# Patient Record
Sex: Male | Born: 1995 | Race: White | Hispanic: Yes | Marital: Single | Smoking: Never smoker
Health system: Southern US, Community
[De-identification: ages and names within clinical notes are randomized; demographics above are authoritative.]

---

## 2020-05-12 ENCOUNTER — Encounter (HOSPITAL_COMMUNITY): Payer: Self-pay | Admitting: Emergency Medicine

## 2020-05-12 ENCOUNTER — Emergency Department (HOSPITAL_COMMUNITY)
Admission: EM | Admit: 2020-05-12 | Discharge: 2020-05-12 | Disposition: A | Discharge status: Home / Self Care | Payer: Self-pay | Attending: Emergency Medicine | Admitting: Emergency Medicine

## 2020-05-12 ENCOUNTER — Other Ambulatory Visit: Payer: Self-pay

## 2020-05-12 DIAGNOSIS — R5383 Other fatigue: Secondary | ICD-10-CM | POA: Insufficient documentation

## 2020-05-12 DIAGNOSIS — R42 Dizziness and giddiness: Secondary | ICD-10-CM | POA: Insufficient documentation

## 2020-05-12 DIAGNOSIS — S0990XA Unspecified injury of head, initial encounter: Secondary | ICD-10-CM

## 2020-05-12 NOTE — ED Notes (Signed)
Pt ambulated independently to bathroom.

## 2020-05-12 NOTE — ED Provider Notes (Signed)
[facility] EMERGENCY DEPARTMENT Provider Note   CSN: [id number] Arrival date & time: 05/12/20  1051     History Chief Complaint  Patient presents with   Dizziness    David Patterson is a 24 y.o. male with no significant past medical history presenting to the ED with a chief complaint of fatigue, dizziness after head injury that occurred 5 days ago.  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  He denies any loss of consciousness.  States that since the head injury he has had dizziness, "mental fatigue" as well as nausea.  He has been placing ice in the area of the injury with some improvement in his pain.  He does admit that he has not been practicing any concussion precautions and has been using bright screens and working.  He has not been taking medications to help with his symptoms.  He denies any numbness in arms or legs, vomiting, neck pain, vision changes, anticoagulant use.  HPI     History reviewed. No pertinent past medical history.  There are no problems to display for this patient.   History reviewed. No pertinent surgical history.     No family history on file.  Social History   Tobacco Use   Smoking status: Never Smoker   Smokeless tobacco: Never Used  Substance Use Topics   Alcohol use: Never   Drug use: Never    Home Medications Prior to Admission medications   Not on File    Allergies    Mango flavor  Review of Systems   Review of Systems  Constitutional: Positive for fatigue. Negative for appetite change, chills and fever.  HENT: Negative for ear pain, rhinorrhea, sneezing and sore throat.   Eyes: Negative for photophobia and visual disturbance.  Respiratory: Negative for cough, chest tightness, shortness of breath and wheezing.   Cardiovascular: Negative for chest pain and palpitations.  Gastrointestinal: Negative for abdominal pain, blood in stool, constipation,  diarrhea, nausea and vomiting.  Genitourinary: Negative for dysuria, hematuria and urgency.  Musculoskeletal: Negative for myalgias.  Skin: Negative for rash.  Neurological: Positive for dizziness and headaches. Negative for weakness and light-headedness.    Physical Exam Updated Vital Signs BP (!) 117/92 (BP Location: Right Arm)    Pulse 79    Temp 98.6 F (37 C) (Oral)    Resp 16    Ht 5\' 10"  (1.778 m)    Wt 113.4 kg    SpO2 97%    BMI 35.87 kg/m   Physical Exam Vitals and nursing note reviewed.  Constitutional:      General: He is not in acute distress.    Appearance: He is well-developed.  HENT:     Head: Normocephalic and atraumatic.     Nose: Nose normal.  Eyes:     General: No scleral icterus.       Right eye: No discharge.        Left eye: No discharge.     Conjunctiva/sclera: Conjunctivae normal.     Pupils: Pupils are equal, round, and reactive to light.  Cardiovascular:     Rate and Rhythm: Normal rate and regular rhythm.     Heart sounds: Normal heart sounds. No murmur heard.  No friction rub. No gallop.   Pulmonary:     Effort: Pulmonary effort is normal. No respiratory distress.     Breath sounds: Normal breath sounds.  Abdominal:  General: Bowel sounds are normal. There is no distension.     Palpations: Abdomen is soft.     Tenderness: There is no abdominal tenderness. There is no guarding.  Musculoskeletal:        General: Normal range of motion.     Cervical back: Normal range of motion and neck supple.  Skin:    General: Skin is warm and dry.     Findings: No rash.  Neurological:     General: No focal deficit present.     Mental Status: He is alert and oriented to person, place, and time.     Cranial Nerves: No cranial nerve deficit.     Sensory: No sensory deficit.     Motor: No weakness or abnormal muscle tone.     Coordination: Coordination normal.     Comments: Pupils reactive. No facial asymmetry noted. Cranial nerves appear grossly intact.  Sensation intact to light touch on face, BUE and BLE. Strength 5/5 in BUE and BLE. Normal finger-to-nose coordination bilaterally.  Normal gait.     ED Results / Procedures / Treatments   Labs (all labs ordered are listed, but only abnormal results are displayed) Labs Reviewed - No data to display  EKG None  Radiology No results found.  Procedures Procedures (including critical care time)  Medications Ordered in ED Medications - No data to display  ED Course  I have reviewed the triage vital signs and the nursing notes.  Pertinent labs & imaging results that were available during my care of the patient were reviewed by me and considered in my medical decision making (see chart for details).    MDM Rules/Calculators/A&P                          24 year old male presenting to the ED with a chief complaint of dizziness, fatigue and nausea after head injury that occurred 5 days ago.  Reports pain to the back of his head where he was struck with a cardboard box filled with blood.  He is concerned due to his ongoing symptoms and is worried about a concussion. He denies anticoagulant use, vision changes, numbness in arms or legs or changes to gait.  On my exam he is overall well-appearing.  He has no neurological deficits noted, no weakness, numbness, confusion or changes to gait noted.  Speaking complete sentences without difficulty without aphasia.  I had a long discussion with the patient regarding risk and benefits of obtaining CT of the head versus continuing supportive treatment.  Informed patient that most likely his symptoms are due to a concussion which can last several days to weeks after injury.  Suspect that his symptoms may not be getting better due to not following any concussion precautions.  Patient contemplated whether or not he felt like a CT scan of the head was necessary and ultimately declined.  I feel that this is reasonable based on his reassuring work-up and time  elapsed from his head injury.  He knows to return for any worsening symptoms and given instructions regarding concussion precautions and will follow up with the concussion clinic.    Patient is hemodynamically stable, in NAD, and able to ambulate in the ED. Evaluation does not show pathology that would require ongoing emergent intervention or inpatient treatment. I explained the diagnosis to the patient. Pain has been managed and has no complaints prior to discharge. Patient is comfortable with above plan and is stable for discharge  at this time. All questions were answered prior to disposition. Strict return precautions for returning to the ED were discussed. Encouraged follow up with PCP.   An After Visit Summary was printed and given to the patient.   Portions of this note were generated with Scientist, clinical (histocompatibility and immunogenetics). Dictation errors may occur despite best attempts at proofreading.  Final Clinical Impression(s) / ED Diagnoses Final diagnoses:  Injury of head, initial encounter    Rx / DC Orders ED Discharge Orders    None       Dietrich Pates, PA-C 05/12/20 1525    Bethann Berkshire, MD 05/12/20 [phone]

## 2020-05-12 NOTE — ED Notes (Signed)
Pt verbalized understanding of discharge instructions. Follow up care and concussion symptoms and management reviewed, pt had no further questions. Ambulated independently to lobby.

## 2020-05-12 NOTE — Discharge Instructions (Addendum)
Follow up with the provider at the concussion clinic listed below. Take Tylenol as needed for discomfort.  Follow instructions regarding concussion precautions. Return to the ER for worsening headache, additional head injuries, numbness in arms or legs, blurry vision or trouble walking.

## 2020-05-12 NOTE — ED Triage Notes (Signed)
Pt states he got hit on the back of his head few days ago and he still feel very fatigue and dizziness from it. Pt requesting to have a CT scan.

## 2020-05-13 ENCOUNTER — Other Ambulatory Visit: Payer: Self-pay

## 2020-07-12 ENCOUNTER — Other Ambulatory Visit: Payer: Self-pay

## 2020-07-12 ENCOUNTER — Emergency Department (HOSPITAL_COMMUNITY)
Admission: EM | Admit: 2020-07-12 | Discharge: 2020-07-12 | Disposition: A | Discharge status: Home / Self Care | Payer: Self-pay | Attending: Emergency Medicine | Admitting: Emergency Medicine

## 2020-07-12 ENCOUNTER — Encounter (HOSPITAL_COMMUNITY): Payer: Self-pay

## 2020-07-12 ENCOUNTER — Emergency Department (HOSPITAL_COMMUNITY): Payer: Self-pay

## 2020-07-12 DIAGNOSIS — R030 Elevated blood-pressure reading, without diagnosis of hypertension: Secondary | ICD-10-CM | POA: Insufficient documentation

## 2020-07-12 DIAGNOSIS — R519 Headache, unspecified: Secondary | ICD-10-CM | POA: Insufficient documentation

## 2020-07-12 NOTE — ED Provider Notes (Signed)
[facility]-EMERGENCY DEPT Provider Note   CSN: [id number] Arrival date & time: 07/12/20  1340     History Chief Complaint  Patient presents with  . Headache    David Patterson is a 24 y.o. male.  Who complains of headache.  Patient states that he has had about 2 weeks of daily headaches.  When he takes Tylenol it goes away.  He states that the headaches are posterior, mild.  He states that he just feels "off."  He did have a concussion about 2 months ago which lasted about a week and a half he then went to an amusement park and rode roller coasters.  He realized that his blood pressure was high here is now worried that maybe that is the cause of his headaches.  He denies changes in vision, difficulty with speech or swallowing, unilateral weakness or paresthesia, difficulty with ambulation, confusion.  Patient denies vertigo symptoms.  He describes his headaches as pressure-like and more in the posterior/occipital region.  He has no other complaints at this time.  HPI     History reviewed. No pertinent past medical history.  There are no problems to display for this patient.   History reviewed. No pertinent surgical history.     No family history on file.  Social History   Tobacco Use  . Smoking status: Never Smoker  . Smokeless tobacco: Never Used  Substance Use Topics  . Alcohol use: Never  . Drug use: Never    Home Medications Prior to Admission medications   Not on File    Allergies    Mango flavor  Review of Systems   Review of Systems Ten systems reviewed and are negative for acute change, except as noted in the HPI.   Physical Exam Updated Vital Signs BP (!) 142/74   Pulse 80   Temp 98.9 F (37.2 C) (Oral)   Resp 18   SpO2 98%   Physical Exam Physical Exam  Constitutional: Pt is oriented to person, place, and time. Pt appears well-developed and well-nourished. No distress.  HENT:  Head: Normocephalic and atraumatic.    Mouth/Throat: Oropharynx is clear and moist.  Eyes: Conjunctivae and EOM are normal. Pupils are equal, round, and reactive to light. No scleral icterus.  No horizontal, vertical or rotational nystagmus  Neck: Normal range of motion. Neck supple.  Full active and passive ROM without pain No midline or paraspinal tenderness No nuchal rigidity or meningeal signs  Cardiovascular: Normal rate, regular rhythm and intact distal pulses.   Pulmonary/Chest: Effort normal and breath sounds normal. No respiratory distress. Pt has no wheezes. No rales.  Abdominal: Soft. Bowel sounds are normal. There is no tenderness. There is no rebound and no guarding.  Musculoskeletal: Normal range of motion.  Lymphadenopathy:    No cervical adenopathy.  Neurological: Pt. is alert and oriented to person, place, and time. He has normal reflexes. No cranial nerve deficit.  Exhibits normal muscle tone. Coordination normal.  Mental Status:  Alert, oriented, thought content appropriate. Speech fluent without evidence of aphasia. Able to follow 2 step commands without difficulty.  Cranial Nerves:  II:  Peripheral visual fields grossly normal, pupils equal, round, reactive to light III,IV, VI: ptosis not present, extra-ocular motions intact bilaterally  V,VII: smile symmetric, facial light touch sensation equal VIII: hearing grossly normal bilaterally  IX,X: midline uvula rise  XI: bilateral shoulder shrug equal and strong XII: midline tongue extension  Motor:  5/5 in upper and lower extremities bilaterally  including strong and equal grip strength and dorsiflexion/plantar flexion Sensory: Pinprick and light touch normal in all extremities.  Deep Tendon Reflexes: 2+ and symmetric  Cerebellar: normal finger-to-nose with bilateral upper extremities Gait: normal gait and balance CV: distal pulses palpable throughout   Skin: Skin is warm and dry. No rash noted. Pt is not diaphoretic.  Psychiatric: Pt has a normal mood  and affect. Behavior is normal. Judgment and thought content normal.  Nursing note and vitals reviewed.   ED Results / Procedures / Treatments   Labs (all labs ordered are listed, but only abnormal results are displayed) Labs Reviewed - No data to display  EKG None  Radiology CT Head Wo Contrast  Result Date: 07/12/2020 CLINICAL DATA:  Headache EXAM: CT HEAD WITHOUT CONTRAST TECHNIQUE: Contiguous axial images were obtained from the base of the skull through the vertex without intravenous contrast. COMPARISON:  None. FINDINGS: Brain: No acute territorial infarction, hemorrhage or intracranial mass. The ventricles are nonenlarged. Vascular: No hyperdense vessel or unexpected calcification. Skull: Normal. Negative for fracture or focal lesion. Sinuses/Orbits: No acute finding. Other: None IMPRESSION: Negative non contrasted CT appearance of the brain. Electronically Signed   By: Jasmine Pang M.D.   On: 07/12/2020 18:57    Procedures Procedures (including critical care time)  Medications Ordered in ED Medications - No data to display  ED Course  I have reviewed the triage vital signs and the nursing notes.  Pertinent labs & imaging results that were available during my care of the patient were reviewed by me and considered in my medical decision making (see chart for details).    MDM Rules/Calculators/A&P                         David Patterson presents with headache Given the large differential diagnosis for David Patterson, the decision making in this case is of high complexity. I personally reviewed the images of the head CT which showed no acute abnormalities.  Patient also here with asymptomatic hypertension.  Discussed lifestyle modifications and outpatient follow-up. After evaluating all of the data points in this case, the presentation of David Patterson is NOT consistent with skull fracture, meningitis/encephalitis, SAH/sentinel bleed, Intracranial Hemorrhage (ICH)  (subdural/epidural), acute obstructive hydrocephalus, space occupying lesions, CVA, CO Poisoning, Basilar/vertebral artery dissection, preeclampsia, cerebral venous thrombosis, hypertensive emergency, temporal Arteritis, Idiopathic Intracranial Hypertension (pseudotumor cerebri).  Strict return and follow-up precautions have been given by me personally or by detailed written instructions verbalized by nursing staff using the teach back method to patient/family/caregiver.  Data Reviewed/Counseling: I have reviewed the patient's vital signs, nursing notes, and other relevant tests/information. I had a detailed discussion regarding the historical points, exam findings, and any diagnostic results supporting the discharge diagnosis. I also discussed the need for outpatient follow-up and the need to return to the ED if symptoms worsen or if there are any questions or concerns that arise at hom   David Patterson was evaluated in Emergency Department on 07/12/2020 for the symptoms described in the history of present illness. He was evaluated in the context of the global COVID-19 pandemic, which necessitated consideration that the patient might be at risk for infection with the SARS-CoV-2 virus that causes COVID-19. Institutional protocols and algorithms that pertain to the evaluation of patients at risk for COVID-19 are in a state of rapid change based on information released by regulatory bodies including the CDC and federal and state organizations. These policies and algorithms were followed during  the patient's care in the ED.  Final Clinical Impression(s) / ED Diagnoses Final diagnoses:  None    Rx / DC Orders ED Discharge Orders    None       Arthor Captain, PA-C 07/12/20 1933    Benjiman Core, MD 07/12/20 2231

## 2020-07-12 NOTE — ED Notes (Signed)
An After Visit Summary was printed and given to the patient. Discharge instructions given and no further questions at this time.  

## 2020-07-12 NOTE — Discharge Instructions (Signed)
Get help right away if:  Your headache becomes severe quickly.  Your headache gets worse after moderate to intense physical activity.  You have repeated vomiting.  You have a stiff neck.  You have a loss of vision.  You have problems with speech.  You have pain in the eye or ear.  You have muscular weakness or loss of muscle control.  You lose your balance or have trouble walking.  You feel faint or pass out.  You have confusion.  You have a seizure.

## 2020-07-12 NOTE — ED Triage Notes (Signed)
Patient reports constant pressure in back of his head/ Patient reports he may have had a concussion about 1.5 months ago from a board hitting the back of his head.   Patient reports his pain today is near the spot he got hit at 1.5 months ago. [place] and having this new pressure for about 2 weeks.

## 2020-08-11 ENCOUNTER — Emergency Department (HOSPITAL_COMMUNITY)
Admission: EM | Admit: 2020-08-11 | Discharge: 2020-08-11 | Disposition: A | Discharge status: Home / Self Care | Payer: Medicaid Other | Attending: Emergency Medicine | Admitting: Emergency Medicine

## 2020-08-11 ENCOUNTER — Encounter (HOSPITAL_COMMUNITY): Payer: Self-pay

## 2020-08-11 DIAGNOSIS — K0889 Other specified disorders of teeth and supporting structures: Secondary | ICD-10-CM

## 2020-08-11 DIAGNOSIS — K029 Dental caries, unspecified: Secondary | ICD-10-CM | POA: Insufficient documentation

## 2020-08-11 LAB — CBG MONITORING, ED: Glucose-Capillary: 115 mg/dL — ABNORMAL HIGH (ref 70–99)

## 2020-08-11 MED ORDER — CLINDAMYCIN HCL 150 MG PO CAPS
450.0000 mg | ORAL_CAPSULE | Freq: Once | ORAL | Status: AC
  Administered 2020-08-11: 450 mg via ORAL
  Filled 2020-08-11: qty 3

## 2020-08-11 MED ORDER — CLINDAMYCIN HCL 150 MG PO CAPS: 450.0000 mg | ORAL_CAPSULE | Freq: Three times a day (TID) | ORAL | 0 refills | Status: AC

## 2020-08-11 MED ORDER — IBUPROFEN 400 MG PO TABS
600.0000 mg | ORAL_TABLET | Freq: Once | ORAL | Status: AC
  Administered 2020-08-11: 600 mg via ORAL
  Filled 2020-08-11: qty 1

## 2020-08-11 NOTE — ED Triage Notes (Signed)
Pt reports left upper dental pain for the past month, pt [place] amoxicillin [facility] [employer] 3 weeks ago and took a 7 day course, noticed some improvement but still having pain and some swelling.

## 2020-08-11 NOTE — ED Provider Notes (Signed)
[facility] EMERGENCY DEPARTMENT Provider Note   CSN: [id number] Arrival date & time: 08/11/20  1116     History Chief Complaint  Patient presents with  . Dental Pain    David Patterson is a 24 y.o. male with no significant past medical history who presents with a 3-week history of left upper dental pain.  Patient reports that he went [place] a [place] [employer] a few weeks ago and obtained amoxicillin 500 mg over-the-counter.  He states that he took it for 3 days and then discontinued the antibiotic.  He states that since then he has continued to endorse some left upper dental pain.  He does not see a dentist.  He does not have a primary care provider.  He has not taken anything for his symptoms of discomfort.  He noticed a little bit of maxillary region swelling the other day, but it has improved.  He does not have any medical or dental insurance.  Patient denies any fevers or chills, neck stiffness, recent travel, difficulty swallowing or breathing, chest pain, or other symptoms.   HPI     History reviewed. No pertinent past medical history.  There are no problems to display for this patient.   History reviewed. No pertinent surgical history.     No family history on file.  Social History   Tobacco Use  . Smoking status: Never Smoker  . Smokeless tobacco: Never Used  Substance Use Topics  . Alcohol use: Never  . Drug use: Never    Home Medications Prior to Admission medications   Medication Sig Start Date End Date Taking? Authorizing Provider  clindamycin (CLEOCIN) 150 MG capsule Take 3 capsules (450 mg total) by mouth 3 (three) times daily for 7 days. 08/11/20 08/18/20  Lorelee New, PA-C    Allergies    Mango flavor  Review of Systems   Review of Systems  Constitutional: Negative for fever.  HENT: Positive for dental problem and facial swelling. Negative for drooling, sore throat and trouble swallowing.   Respiratory: Negative for  shortness of breath, wheezing and stridor.     Physical Exam Updated Vital Signs BP 124/65 (BP Location: Right Arm)   Pulse 71   Temp 98.3 F (36.8 C) (Oral)   Resp 16   Ht 5\' 10"  (1.778 m)   Wt 117.9 kg   SpO2 99%   BMI 37.31 kg/m   Physical Exam Vitals and nursing note reviewed. Exam conducted with a chaperone present.  Constitutional:      General: He is not in acute distress.    Appearance: Normal appearance. He is not ill-appearing.  HENT:     Head: Normocephalic and atraumatic.     Mouth/Throat:     Comments: Carrie appreciated involving #12 tooth with mild surrounding erythema and swelling.  No fluctuance or appreciable abscess.  No trismus.  Tolerating secretions well.  Patent oropharynx.  No tongue swelling or floor of mouth induration.  Relatively good dentition throughout. Eyes:     General: No scleral icterus.    Conjunctiva/sclera: Conjunctivae normal.  Cardiovascular:     Rate and Rhythm: Normal rate and regular rhythm.     Pulses: Normal pulses.     Heart sounds: Normal heart sounds.  Pulmonary:     Effort: Pulmonary effort is normal. No respiratory distress.     Breath sounds: Normal breath sounds. No stridor. No wheezing.  Musculoskeletal:     Cervical back: Normal range of motion and neck supple.  No rigidity.  Skin:    General: Skin is dry.     Capillary Refill: Capillary refill takes less than 2 seconds.  Neurological:     Mental Status: He is alert and oriented to person, place, and time.     GCS: GCS eye subscore is 4. GCS verbal subscore is 5. GCS motor subscore is 6.  Psychiatric:        Mood and Affect: Mood normal.        Behavior: Behavior normal.        Thought Content: Thought content normal.     ED Results / Procedures / Treatments   Labs (all labs ordered are listed, but only abnormal results are displayed) Labs Reviewed  CBG MONITORING, ED - Abnormal; Notable for the following components:      Result Value   Glucose-Capillary 115  (*)    All other components within normal limits    EKG None  Radiology No results found.  Procedures Procedures (including critical care time)  Medications Ordered in ED Medications  clindamycin (CLEOCIN) capsule 450 mg (has no administration in time range)  ibuprofen (ADVIL) tablet 600 mg (has no administration in time range)    ED Course  I have reviewed the triage vital signs and the nursing notes.  Pertinent labs & imaging results that were available during my care of the patient were reviewed by me and considered in my medical decision making (see chart for details).    MDM Rules/Calculators/A&P                          Patient has a well visualized carie on the #12 tooth.  There is mild surrounding erythema and swelling, no appreciable abscess.  No fluctuance.  Patient felt faint in the ED and was concerned about his blood sugars.  Obtained point-of-care CBG.  Patient presents to the ED with dental pain, but without any abscess amenable to drainage.  He does have very mild left-sided maxillary swelling, but no warmth, redness, or other overlying skin changes.  I have low suspicion for a large mandibular or other deep tissue abscess at this time.  Do not feel as though CT imaging warranted at this time.  He denies any fevers or chills, difficulty swallowing or breathing, or other symptoms.  Patient able to speak in complete sentences without difficulty swallowing or trouble handling secretions.  No stridor, wheezing, tripoding, grey pseudomembrane, trismus, uvular deviation, sublingual/submandibular/submental swelling or induration, nuchal rigidity, or neck pain.  Low suspicion for deep tissue infection such as mandibular or maxillary abscess, PTA or RPA, epiglottitis, or other emergent pathology.   We will provide patient with first dose of clindamycin here in the ED as well as a shot of Toradol.  Will discharge him home with continued clindamycin antibiotics and provide  patient with dental resources.  I will also provide her with a referral to [facility] so that she may get established with a primary care provider for ongoing evaluation and management of her health and wellbeing.   Final Clinical Impression(s) / ED Diagnoses Final diagnoses:  Pain, dental    Rx / DC Orders ED Discharge Orders         Ordered    clindamycin (CLEOCIN) 150 MG capsule  3 times daily        08/11/20 1235           Lorelee New, PA-C 08/11/20 1255  Milagros Loll, MD 08/13/20 [phone]

## 2020-08-11 NOTE — Discharge Instructions (Signed)
Please read the attachment on local dental resources.  Please take clindamycin, as directed.  I recommend that you take ibuprofen 600 mg three times daily as needed for pain or inflammation.  The antibiotics will help, but you need definitive dental intervention.  Please see one as soon as possible.  I would also like you to establish with a primary care provider at [facility].  Please call them to schedule appointment.  Return to the ED or seek immediate medical attention should you experience any new or worsening symptoms.

## 2021-06-29 ENCOUNTER — Emergency Department (HOSPITAL_COMMUNITY)
Admission: EM | Admit: 2021-06-29 | Discharge: 2021-06-29 | Disposition: A | Discharge status: Home / Self Care | Payer: Medicaid Other | Attending: Emergency Medicine | Admitting: Emergency Medicine

## 2021-06-29 DIAGNOSIS — H5712 Ocular pain, left eye: Secondary | ICD-10-CM

## 2021-06-29 MED ORDER — FLUORESCEIN SODIUM 1 MG OP STRP
1.0000 | ORAL_STRIP | Freq: Once | OPHTHALMIC | Status: AC
  Administered 2021-06-29: 1 via OPHTHALMIC
  Filled 2021-06-29: qty 1

## 2021-06-29 MED ORDER — TETRACAINE HCL 0.5 % OP SOLN
2.0000 [drp] | Freq: Once | OPHTHALMIC | Status: AC
  Administered 2021-06-29: 2 [drp] via OPHTHALMIC
  Filled 2021-06-29: qty 4

## 2021-06-29 NOTE — ED Triage Notes (Signed)
Pt c/o L eye pain/ dryness x2 days. Was using alcohol spray & thinks some accidentally got in his eye. Pt able to see but endorses some "spots" in vision. L eye appears normal

## 2021-06-29 NOTE — ED Provider Notes (Signed)
[facility] EMERGENCY DEPARTMENT Provider Note   CSN: [id number] Arrival date & time: 06/29/21  1756     History Chief Complaint  Patient presents with   Eye Pain    David Patterson is a 25 y.o. male.   Eye Pain Pertinent negatives include no chest pain, no abdominal pain and no shortness of breath.   25 year old male with left eye pain x2 days.  Patient reports that 2 days ago, he was cleaning wound with isopropyl alcohol.  He states that when he sprayed it on the wound, he felt like some splashed into his eye.  He experiences burning pain in his left eye.  He washed his eye for 2 to 3 minutes and the pain improved significantly.  However, he states that he has had some mild constant, nonradiating burning sensation in the left eye since that time.  He also feels except as dry as he feels like when he lifts up his eyelid, it seems stuck to his eyeball.  He denies any eye redness.  He denies any right eye pain or redness.  He denies any drainage from the left eye.  He states that his left eye feels slightly more swollen than usual.  He denies any eye discharge.  He denies any blurry vision or double vision.  He does not wear contacts.  No history of similar symptoms.  No therapies tried at home.  No past medical history on file.  There are no problems to display for this patient.   No past surgical history on file.     No family history on file.  Social History   Tobacco Use   Smoking status: Never   Smokeless tobacco: Never  Substance Use Topics   Alcohol use: Never   Drug use: Never    Home Medications Prior to Admission medications   Not on File    Allergies    Mango flavor  Review of Systems   Review of Systems  Constitutional:  Negative for chills and fever.  HENT:  Negative for ear pain and sore throat.   Eyes:  Positive for pain. Negative for photophobia, redness, itching and visual disturbance.  Respiratory:  Negative for cough and  shortness of breath.   Cardiovascular:  Negative for chest pain and palpitations.  Gastrointestinal:  Negative for abdominal pain and vomiting.  Genitourinary:  Negative for dysuria and hematuria.  Musculoskeletal:  Negative for arthralgias and back pain.  Skin:  Negative for color change and rash.  Neurological:  Negative for seizures and syncope.  All other systems reviewed and are negative.  Physical Exam Updated Vital Signs BP (!) 146/80 (BP Location: Right Arm)   Pulse 73   Temp 98.2 F (36.8 C) (Oral)   Resp 18   SpO2 99%   Physical Exam Vitals and nursing note reviewed.  Constitutional:      Appearance: Normal appearance. He is well-developed and normal weight.  HENT:     Head: Normocephalic and atraumatic.  Eyes:     General:        Right eye: No discharge.        Left eye: No discharge.     Extraocular Movements: Extraocular movements intact.     Conjunctiva/sclera: Conjunctivae normal.     Pupils: Pupils are equal, round, and reactive to light.     Comments: No conjunctival injection.  On slit-lamp exam, no there is no cell and flare.  No increased fluorescein uptake.  Normal vision.  Cardiovascular:  Rate and Rhythm: Normal rate and regular rhythm.     Heart sounds: No murmur heard. Pulmonary:     Effort: Pulmonary effort is normal. No respiratory distress.     Breath sounds: Normal breath sounds.  Abdominal:     Palpations: Abdomen is soft.     Tenderness: There is no abdominal tenderness.  Musculoskeletal:     Cervical back: Neck supple.  Skin:    General: Skin is warm and dry.     Capillary Refill: Capillary refill takes less than 2 seconds.  Neurological:     Mental Status: He is alert and oriented to person, place, and time.    ED Results / Procedures / Treatments   Labs (all labs ordered are listed, but only abnormal results are displayed) Labs Reviewed - No data to display  EKG None  Radiology No results found.  Procedures Procedures    Medications Ordered in ED Medications  tetracaine (PONTOCAINE) 0.5 % ophthalmic solution 2 drop (2 drops Right Eye Given 06/29/21 2120)  fluorescein ophthalmic strip 1 strip (1 strip Left Eye Given 06/29/21 2120)    ED Course  I have reviewed the triage vital signs and the nursing notes.  Pertinent labs & imaging results that were available during my care of the patient were reviewed by me and considered in my medical decision making (see chart for details).    MDM Rules/Calculators/A&P                           25 year old male with left eye pain following exposure to isopropyl alcohol in the left eye 2 days ago.  Vital signs reviewed, within acceptable limits.  Differential includes iritis, chemical exposure, dry eyes, obstructed lacrimal gland.  He has no headache, pupils are reactive, no concern for acute angle-closure glaucoma.  No evidence of iritis on slit exam.  The patient's conjunctive are completely clear.  Visual acuity is normal.  No evidence of erosion or ulceration on fluorescein exam.  Presentation seems most consistent with dry eyes following isopropyl alcohol exposure.  He appears very comfortable.  Given his normal visual exam, I do not believe there is an indication for emergent irrigation given the duration since exposure and his normal ophthalmologic exam.  I believe he is stable for discharge with artificial tears at home.  Patient is comfortable with that plan.  Strict return precautions discussed.  Final Clinical Impression(s) / ED Diagnoses Final diagnoses:  Pain of left eye    Rx / DC Orders ED Discharge Orders     None        Lenard Lance, MD 06/29/21 2132    Cathren Laine, MD 06/29/21 2302

## 2021-06-29 NOTE — Discharge Instructions (Addendum)
Your physical exam was reassuring.  There is no evidence of an abrasion or other abnormality.  I suspect that the isopropyl alcohol dried out your eyes and cause some irritation.  I would recommend a lubricant eyedrop such as artificial tears.  The brands of refresh and Systane are generally recommended, but any over-the-counter artificial tear would suffice..  Please follow-up with your primary care doctor.

## 2021-06-29 NOTE — ED Notes (Signed)
Pt stepped outside.  

## 2021-06-29 NOTE — ED Provider Notes (Signed)
Emergency Medicine Provider Triage Evaluation Note  David Patterson , a 25 y.o. male  was evaluated in triage.  Pt complains of L eye irritation.  Review of Systems  Positive: L eye irritation, eye pain, spot in vision Negative: Fever, headache, diplopia, loss of vision  Physical Exam  BP (!) 143/81 (BP Location: Left Arm)   Pulse 79   Temp 98.5 F (36.9 C)   Resp 18   SpO2 100%  Gen:   Awake, no distress   Resp:  Normal effort  MSK:   Moves extremities without difficulty  Other:  L eye with normal appearance  Medical Decision Making  Medically screening exam initiated at 6:52 PM.  Appropriate orders placed.  David Patterson was informed that the remainder of the evaluation will be completed by another provider, this initial triage assessment does not replace that evaluation, and the importance of remaining in the ED until their evaluation is complete.  Pt report he was using isopropyl alcohol spray 2 days ago and some of the spray may have got into his L eye.  Since then he has noticed dryness and pain about the L eye.  Occasionally see some black spots in his vision.  Does not wear contact lens.     Fayrene Helper, PA-C 06/29/21 1854    Koleen Distance, MD 06/29/21 [phone]

## 2021-06-29 NOTE — ED Notes (Signed)
Patient verbalizes understanding of discharge instructions. Opportunity for questioning and answers were provided. Armband removed by staff, pt discharged from ED ambulatory.   

## 2021-10-16 ENCOUNTER — Encounter (HOSPITAL_COMMUNITY): Payer: Self-pay

## 2021-10-16 ENCOUNTER — Emergency Department (HOSPITAL_COMMUNITY): Payer: Self-pay

## 2021-10-16 ENCOUNTER — Emergency Department (HOSPITAL_COMMUNITY)
Admission: EM | Admit: 2021-10-16 | Discharge: 2021-10-16 | Disposition: A | Discharge status: Home / Self Care | Payer: Self-pay | Attending: Emergency Medicine | Admitting: Emergency Medicine

## 2021-10-16 DIAGNOSIS — R110 Nausea: Secondary | ICD-10-CM | POA: Insufficient documentation

## 2021-10-16 DIAGNOSIS — K5900 Constipation, unspecified: Secondary | ICD-10-CM | POA: Insufficient documentation

## 2021-10-16 DIAGNOSIS — R1030 Lower abdominal pain, unspecified: Secondary | ICD-10-CM | POA: Insufficient documentation

## 2021-10-16 LAB — CBC WITH DIFFERENTIAL/PLATELET
Abs Immature Granulocytes: 0.02 10*3/uL (ref 0.00–0.07)
Basophils Absolute: 0.1 10*3/uL (ref 0.0–0.1)
Basophils Relative: 1 %
Eosinophils Absolute: 0.1 10*3/uL (ref 0.0–0.5)
Eosinophils Relative: 2 %
HCT: 52.2 % — ABNORMAL HIGH (ref 39.0–52.0)
Hemoglobin: 17.7 g/dL — ABNORMAL HIGH (ref 13.0–17.0)
Immature Granulocytes: 0 %
Lymphocytes Relative: 27 %
Lymphs Abs: 1.8 10*3/uL (ref 0.7–4.0)
MCH: 29.4 pg (ref 26.0–34.0)
MCHC: 33.9 g/dL (ref 30.0–36.0)
MCV: 86.6 fL (ref 80.0–100.0)
Monocytes Absolute: 0.4 10*3/uL (ref 0.1–1.0)
Monocytes Relative: 5 %
Neutro Abs: 4.4 10*3/uL (ref 1.7–7.7)
Neutrophils Relative %: 65 %
Platelets: 318 10*3/uL (ref 150–400)
RBC: 6.03 MIL/uL — ABNORMAL HIGH (ref 4.22–5.81)
RDW: 11.9 % (ref 11.5–15.5)
WBC: 6.8 10*3/uL (ref 4.0–10.5)
nRBC: 0 % (ref 0.0–0.2)

## 2021-10-16 LAB — COMPREHENSIVE METABOLIC PANEL
ALT: 39 U/L (ref 0–44)
AST: 26 U/L (ref 15–41)
Albumin: 3.8 g/dL (ref 3.5–5.0)
Alkaline Phosphatase: 54 U/L (ref 38–126)
Anion gap: 9 (ref 5–15)
BUN: 14 mg/dL (ref 6–20)
CO2: 24 mmol/L (ref 22–32)
Calcium: 9.4 mg/dL (ref 8.9–10.3)
Chloride: 105 mmol/L (ref 98–111)
Creatinine, Ser: 0.91 mg/dL (ref 0.61–1.24)
GFR, Estimated: >60 mL/min (ref >60)
Glucose, Bld: 112 mg/dL — ABNORMAL HIGH (ref 70–99)
Potassium: 4.1 mmol/L (ref 3.5–5.1)
Sodium: 138 mmol/L (ref 135–145)
Total Bilirubin: 1 mg/dL (ref 0.3–1.2)
Total Protein: 6.9 g/dL (ref 6.5–8.1)

## 2021-10-16 LAB — URINALYSIS, ROUTINE W REFLEX MICROSCOPIC
Bilirubin Urine: NEGATIVE
Glucose, UA: NEGATIVE mg/dL
Hgb urine dipstick: NEGATIVE
Ketones, ur: NEGATIVE mg/dL
Leukocytes,Ua: NEGATIVE
Nitrite: NEGATIVE
Protein, ur: NEGATIVE mg/dL
Specific Gravity, Urine: 1.015 (ref 1.005–1.030)
pH: 7 (ref 5.0–8.0)

## 2021-10-16 LAB — LIPASE, BLOOD: Lipase: 27 U/L (ref 11–51)

## 2021-10-16 MED ORDER — IOHEXOL 300 MG/ML  SOLN
100.0000 mL | Freq: Once | INTRAMUSCULAR | Status: AC | PRN
  Administered 2021-10-16: 100 mL via INTRAVENOUS

## 2021-10-16 MED ORDER — ONDANSETRON HCL 4 MG PO TABS: 4.0000 mg | ORAL_TABLET | Freq: Three times a day (TID) | ORAL | 0 refills | Status: AC | PRN

## 2021-10-16 NOTE — Discharge Instructions (Addendum)
Your history, exam, work-up today were overall reassuring.  CT scan did not show any concerning findings and the ultrasound also did not show evidence of testicular abnormality.  Clinically I suspect you may have some constipation and dehydration leading to some of your bowel changes and discomfort.  There was no evidence of acute hernia.  Please follow-up with your primary doctor and try to increase your hydration and rest.  If any symptoms change or worsen acutely, please return to the nearest emergency department.  If you start having nausea, please use the nausea medicine.

## 2021-10-16 NOTE — ED Provider Notes (Signed)
Emergency Medicine Provider Triage Evaluation Note  David Patterson , a 25 y.o. male  was evaluated in triage.  Pt complains of lower abdominal discomfort.  Patient describes it as a "heaviness".  Pain has been present for last 3 to 4 days.  He endorses nausea however, no vomiting.  Patient states pain goes to bilateral testicles.  Denies penile discharge.  Patient is concerned about a possible hernia.  No previous abdominal operations.  Denies urinary symptoms.  Review of Systems  Positive: Abdominal pain Negative: fever  Physical Exam  BP 128/82 (BP Location: Right Arm)   Pulse 73   Temp 98.4 F (36.9 C) (Oral)   Resp 18   Ht 5\' 10"  (1.778 m)   Wt 117.9 kg   SpO2 99%   BMI 37.31 kg/m  Gen:   Awake, no distress   Resp:  Normal effort  MSK:   Moves extremities without difficulty  Other:  Mild lower abdominal tenderness. GU exam performed with chaperone in room. No testicular tenderness or edema.   Medical Decision Making  Medically screening exam initiated at 11:51 AM.  Appropriate orders placed.  Abdikadir Fohl was informed that the remainder of the evaluation will be completed by another provider, this initial triage assessment does not replace that evaluation, and the importance of remaining in the ED until their evaluation is complete.  Abdominal labs   Carita Pian 10/16/21 1153    14/12/22, MD 10/16/21 [phone]

## 2021-10-16 NOTE — ED Triage Notes (Signed)
Pt c/o lower abdominal pain described as bloating for 3-4 days. Endorses intermittent nausea without vomiting during that time. Pt states he has concern for hernia, though does not describe protrusion from belly. Pt endorses slight inguinal swelling.

## 2021-10-16 NOTE — ED Notes (Signed)
Discharge instructions reviewed with patient and family. Patient and family verbalized understanding of instructions. Follow-up care and medications were reviewed. Patient ambulatory with steady gait. VSS upon discharge.  °

## 2021-10-16 NOTE — ED Provider Notes (Signed)
[facility] EMERGENCY DEPARTMENT Provider Note   CSN: [id number] Arrival date & time: 10/16/21  1111     History Chief Complaint  Patient presents with   Abdominal Pain    David Patterson is a 25 y.o. male.  The history is provided by the patient and medical records. No language interpreter was used.  Abdominal Pain Pain location:  Suprapubic Pain quality: aching and cramping   Pain radiates to:  Scrotum Pain severity:  Moderate Onset quality:  Gradual Duration:  4 days Timing:  Constant Progression:  Waxing and waning Chronicity:  New Context: not previous surgeries and not recent illness   Relieved by:  Nothing Worsened by:  Nothing Ineffective treatments:  None tried Associated symptoms: constipation and nausea   Associated symptoms: no anorexia, no chest pain, no chills, no cough, no diarrhea, no fatigue, no fever, no hematuria, no shortness of breath and no vomiting       History reviewed. No pertinent past medical history.  There are no problems to display for this patient.   History reviewed. No pertinent surgical history.     No family history on file.  Social History   Tobacco Use   Smoking status: Never   Smokeless tobacco: Never  Substance Use Topics   Alcohol use: Never   Drug use: Never    Home Medications Prior to Admission medications   Not on File    Allergies    Mango flavor  Review of Systems   Review of Systems  Constitutional:  Negative for chills, diaphoresis, fatigue and fever.  HENT:  Negative for congestion.   Eyes:  Negative for photophobia.  Respiratory:  Negative for cough, chest tightness, shortness of breath and wheezing.   Cardiovascular:  Negative for chest pain, palpitations and leg swelling.  Gastrointestinal:  Positive for abdominal pain, constipation and nausea. Negative for abdominal distention, anorexia, blood in stool, diarrhea and vomiting.  Genitourinary:  Positive for genital sores and  testicular pain. Negative for decreased urine volume, flank pain, hematuria, penile pain, penile swelling and scrotal swelling.  Musculoskeletal:  Negative for back pain, neck pain and neck stiffness.  Skin:  Negative for rash and wound.  Neurological:  Negative for headaches.  Psychiatric/Behavioral:  Negative for agitation and confusion.   All other systems reviewed and are negative.  Physical Exam Updated Vital Signs BP 126/77 (BP Location: Right Arm)   Pulse 70   Temp 98.4 F (36.9 C) (Oral)   Resp 16   Ht 5\' 10"  (1.778 m)   Wt 117.9 kg   SpO2 99%   BMI 37.31 kg/m   Physical Exam Vitals and nursing note reviewed. Exam conducted with a chaperone present.  Constitutional:      General: He is not in acute distress.    Appearance: He is well-developed. He is not ill-appearing, toxic-appearing or diaphoretic.  HENT:     Head: Normocephalic and atraumatic.  Eyes:     Conjunctiva/sclera: Conjunctivae normal.  Cardiovascular:     Rate and Rhythm: Normal rate and regular rhythm.     Heart sounds: No murmur heard. Pulmonary:     Effort: Pulmonary effort is normal. No respiratory distress.     Breath sounds: Normal breath sounds.  Abdominal:     General: Abdomen is flat. Bowel sounds are normal. There is no distension.     Palpations: Abdomen is soft.     Tenderness: There is no abdominal tenderness. There is no right CVA tenderness, left CVA tenderness,  guarding or rebound.     Hernia: No hernia is present.  Genitourinary:    Penis: Normal.      Testes:        Right: Tenderness not present.        Left: Tenderness not present.  Musculoskeletal:        General: No swelling.     Cervical back: Neck supple.  Skin:    General: Skin is warm and dry.     Capillary Refill: Capillary refill takes less than 2 seconds.     Coloration: Skin is not pale.  Neurological:     Mental Status: He is alert.  Psychiatric:        Mood and Affect: Mood normal.    ED Results / Procedures  / Treatments   Labs (all labs ordered are listed, but only abnormal results are displayed) Labs Reviewed  CBC WITH DIFFERENTIAL/PLATELET - Abnormal; Notable for the following components:      Result Value   RBC 6.03 (*)    Hemoglobin 17.7 (*)    HCT 52.2 (*)    All other components within normal limits  COMPREHENSIVE METABOLIC PANEL - Abnormal; Notable for the following components:   Glucose, Bld 112 (*)    All other components within normal limits  LIPASE, BLOOD  URINALYSIS, ROUTINE W REFLEX MICROSCOPIC    EKG None  Radiology CT ABDOMEN PELVIS W CONTRAST  Result Date: 10/16/2021 CLINICAL DATA:  Abdominal pain, acute, nonlocalized EXAM: CT ABDOMEN AND PELVIS WITH CONTRAST TECHNIQUE: Multidetector CT imaging of the abdomen and pelvis was performed using the standard protocol following bolus administration of intravenous contrast. CONTRAST:  OMNIPAQUE IOHEXOL 300 MG/ML  SOLN COMPARISON:  None. FINDINGS: Lower chest: No acute abnormality. Hepatobiliary: No focal liver abnormality is seen. No gallstones, gallbladder wall thickening, or biliary dilatation. Pancreas: Unremarkable. No pancreatic ductal dilatation or surrounding inflammatory changes. Spleen: Normal in size without focal abnormality. Adrenals/Urinary Tract: Adrenals, kidneys, and partially distended bladder are unremarkable. Stomach/Bowel: Stomach is within normal limits. Bowel is normal in caliber. Normal appendix. Vascular/Lymphatic: No significant vascular abnormality on this noncontrast study. No enlarged lymph nodes. Reproductive: Unremarkable. Other: No free fluid.  Abdominal wall is unremarkable. 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 IMPRESSION: No acute abnormality. Electronically Signed   By: Guadlupe Spanish M.D.   On: 10/16/2021 14:51   US SCROTUM W/DOPPLER  Result Date: 10/16/2021 CLINICAL DATA:  Testicular pain, left greater than right EXAM: SCROTAL ULTRASOUND DOPPLER ULTRASOUND OF  THE TESTICLES TECHNIQUE: Complete ultrasound examination of the testicles, epididymis, and other scrotal structures was performed. Color and spectral Doppler ultrasound were also utilized to evaluate blood flow to the testicles. COMPARISON:  None. FINDINGS: Right testicle Measurements: 5.0 x 2.7 x 3.4 cm. No mass or microlithiasis visualized. Left testicle Measurements: 4.8 x 2.6 x 3.2 cm. No mass or microlithiasis visualized. Right epididymis:  Normal in size and appearance. Left epididymis:  Normal in size and appearance. Hydrocele:  None visualized. Varicocele:  None visualized. Pulsed Doppler interrogation of both testes demonstrates normal low resistance arterial and venous waveforms bilaterally. IMPRESSION: No testicular abnormality.  No evidence of torsion. Electronically Signed   By: Charlett Nose M.D.   On: 10/16/2021 22:01    Procedures Procedures   Medications Ordered in ED Medications  iohexol (OMNIPAQUE) 300 MG/ML solution 100 mL (100 mLs Intravenous Contrast Given 10/16/21 1433)    ED Course  I have reviewed the triage vital signs and the nursing notes.  Pertinent  labs & imaging results that were available during my care of the patient were reviewed by me and considered in my medical decision making (see chart for details).    MDM Rules/Calculators/A&P                           Avaneesh Genrich is a 25 y.o. male with no significant past medical history who presents with several days of abdominal discomfort and groin discomfort.  He reports that he has had pain for the last 3 to 4 days that was felt like aching or bloating in his lower abdomen that did go into his scrotum at times.  He denies any trauma but thought there may be some swelling concerning for hernia.  He has no history of hernia or testicular injury or torsion.  Denies any dysuria, hematuria, flank pain, or back pain.  Denies any fevers, chills congestion, cough.  He does report some nausea but no vomiting.  He does report  some constipation but no diarrhea.  No rectal bleeding or penile bleeding or discharge reported.  On exam, lungs clear and chest nontender.  Abdomen was nontender.  Normal bowel sounds.  No flank tenderness or back tenderness.  With a chaperone, testicular ultrasound and scrotal exam was reassuring.  No evidence of hernia on exam.  Patient was seen in triage and had CT imaging to rule out hernia and this was negative.  Patient also screening labs are reassuring.  Urinalysis did not show evidence of infection.  Patient was still expressing concern for testicular problem so given the reported intermittent pain, will get ultrasound to look for abnormalities.  Ultrasound reassuring.  Clinically I suspect some constipation causing some abdominal discomfort and will give prescription for nausea medicine.  Patient will increase hydration and rest and follow-up with PCP.  Again with questions or concerns and was discharged in good condition after reassuring work-up.         Final Clinical Impression(s) / ED Diagnoses Final diagnoses:  Lower abdominal pain  Nausea  Constipation, unspecified constipation type    Rx / DC Orders ED Discharge Orders          Ordered    ondansetron (ZOFRAN) 4 MG tablet  Every 8 hours PRN        10/16/21 2226            Clinical Impression: 1. Lower abdominal pain   2. Nausea   3. Constipation, unspecified constipation type     Disposition: Discharge  Condition: Good  I have discussed the results, Dx and Tx plan with the pt(& family if present). He/she/they expressed understanding and agree(s) with the plan. Discharge instructions discussed at great length. Strict return precautions discussed and pt &/or family have verbalized understanding of the instructions. No further questions at time of discharge.    New Prescriptions   ONDANSETRON (ZOFRAN) 4 MG TABLET    Take 1 tablet (4 mg total) by mouth every 8 (eight) hours as needed for nausea or  vomiting.    Follow Up: [facility] [phone] Schedule an appointment as soon as possible for a visit    [place] [id number] [address] [place]       Jonnathan Birman, Gwenyth Allegra, MD 10/16/21 2234

## 2021-12-23 IMAGING — CT CT HEAD W/O CM
3 series · 16 of 47 positions shown, 19 images · non-contrast
Comparison: None.

CLINICAL DATA: Headache

EXAM:
CT HEAD WITHOUT CONTRAST
TECHNIQUE: Contiguous axial images were obtained from the base of the skull
through the vertex without intravenous contrast.

[Series 3: head wo · axial · 0.44mm/px · z∈[+1637,+1772]mm · 10 of 33 slices shown, 13 images]
[im 3/33  brain]
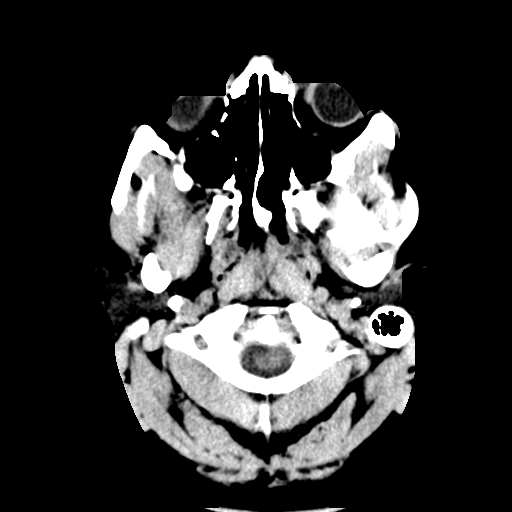
[im 3/33  bone]
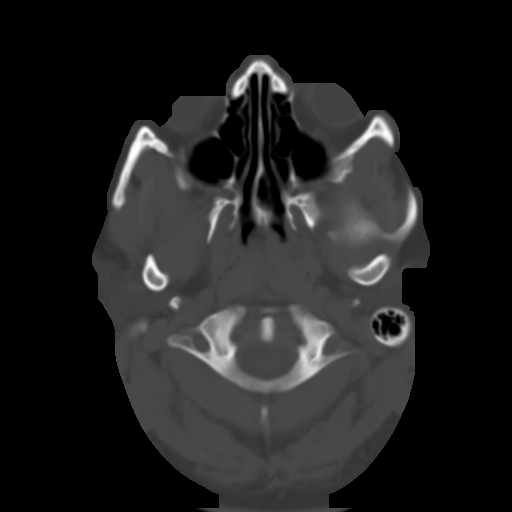
[im 6/33  brain]
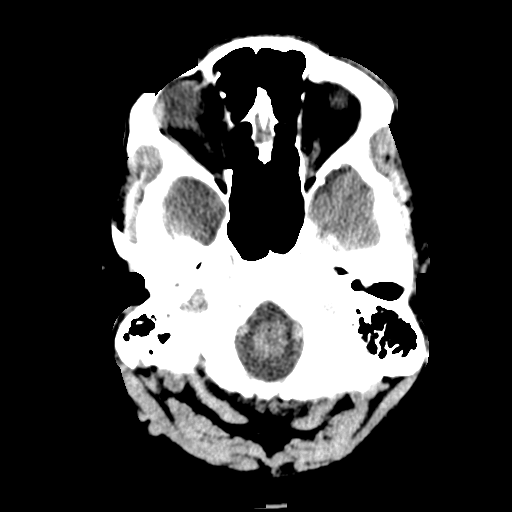
[im 9/33  brain]
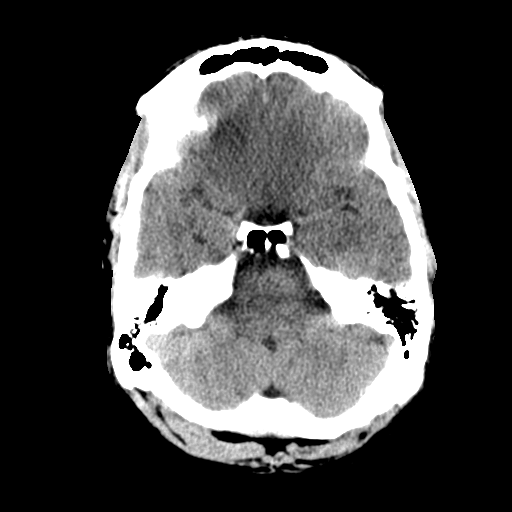
[im 12/33  brain]
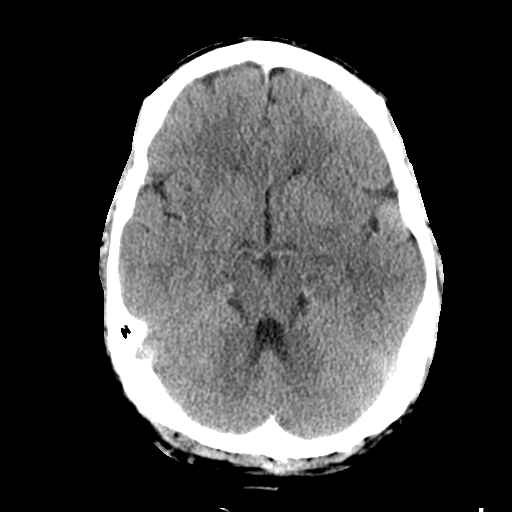
[im 15/33  brain]
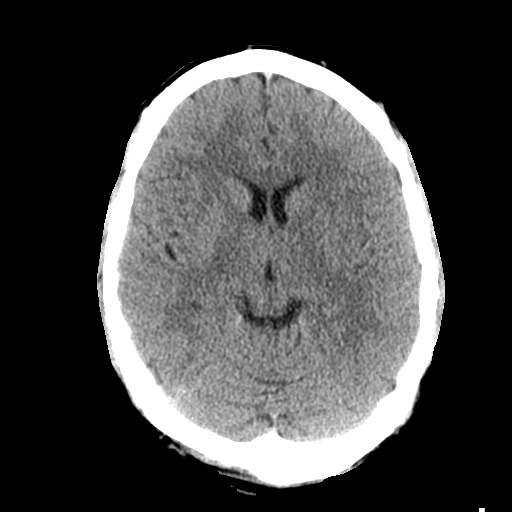
[im 15/33  bone]
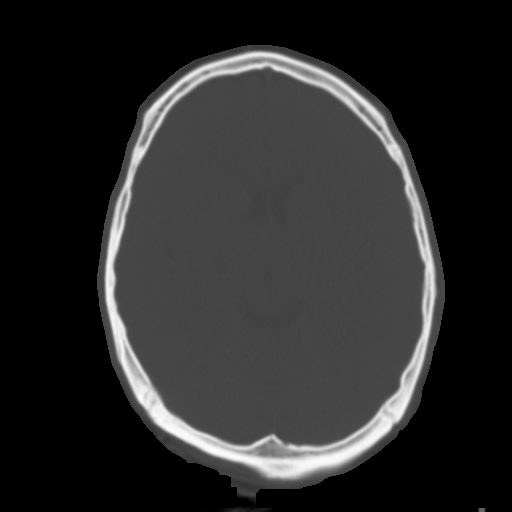
[im 18/33  brain]
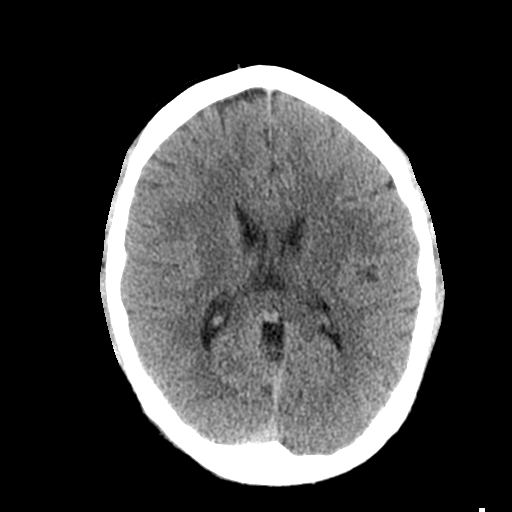
[im 21/33  brain]
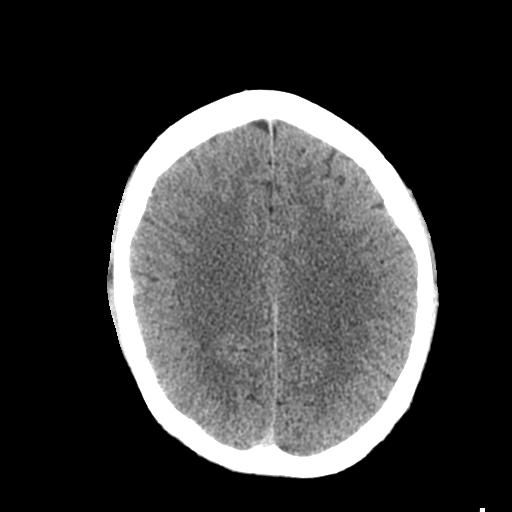
[im 25/33  brain]
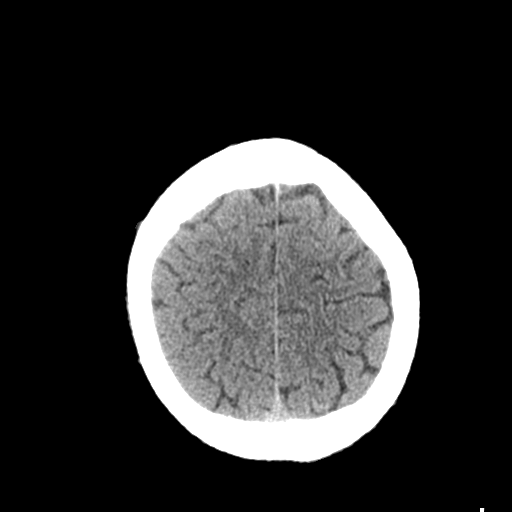
[im 27/33  brain]
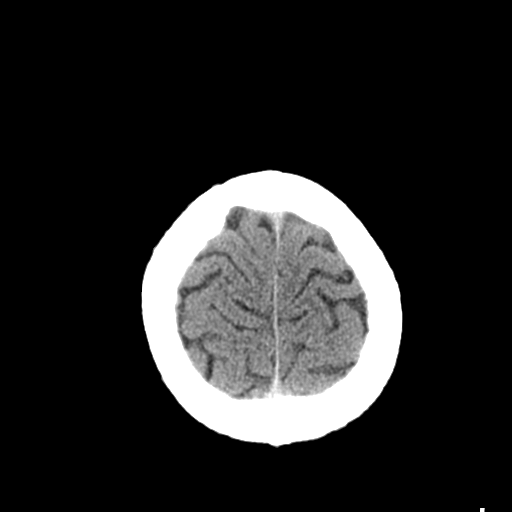
[im 27/33  bone]
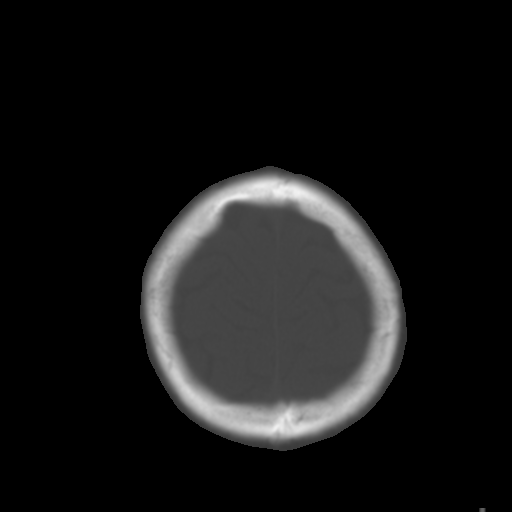
[im 30/33  brain]
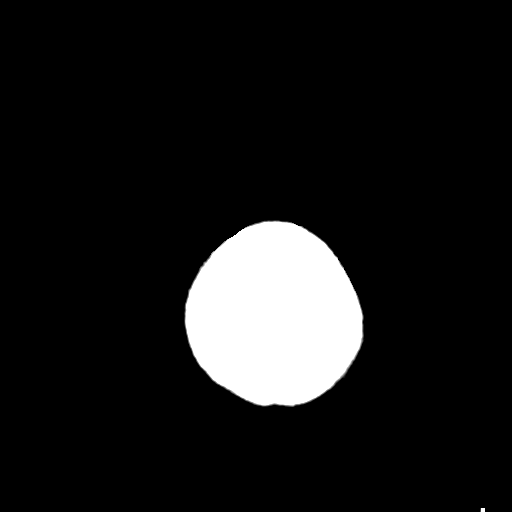

[Series 5: coronal soft tissue · coronal · 0.32mm/px · 3 of 70 slices shown]
[im 24/70  brain]
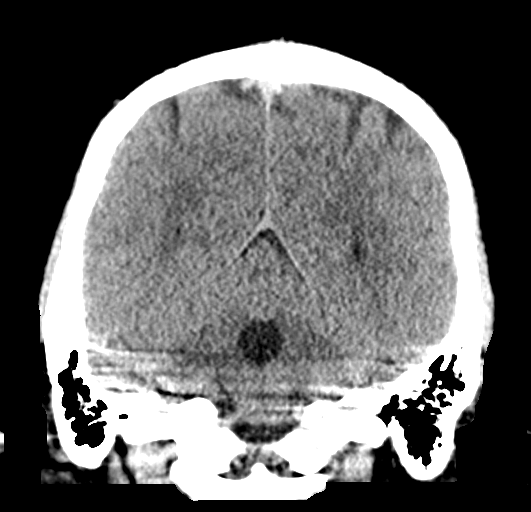
[im 31/70  brain]
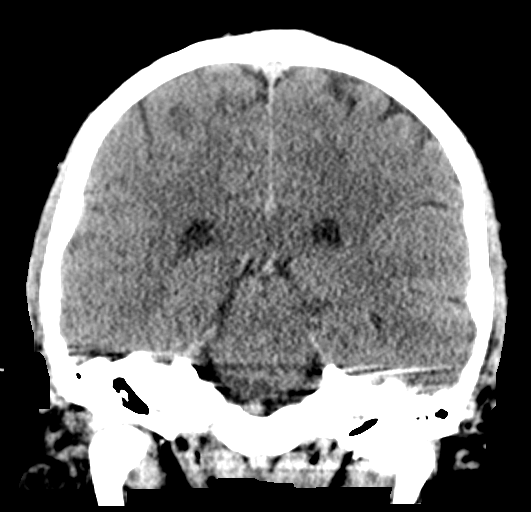
[im 39/70  brain]
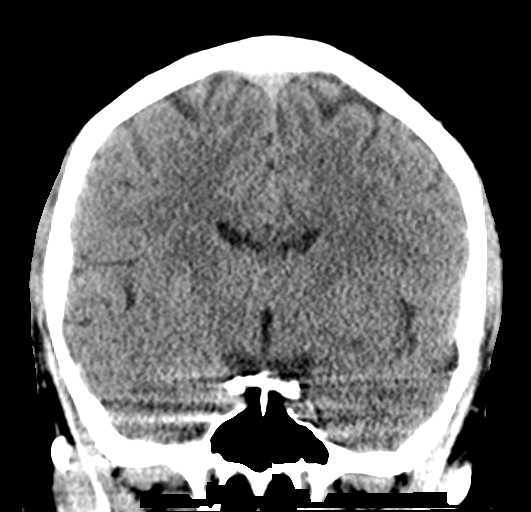

[Series 6: sagittal soft tissue · sagittal · 0.32mm/px · 3 of 58 slices shown]
[im 20/58  brain]
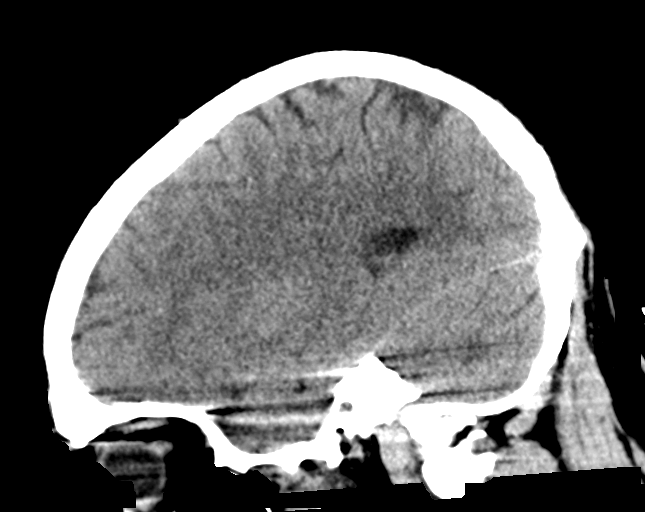
[im 29/58  brain]
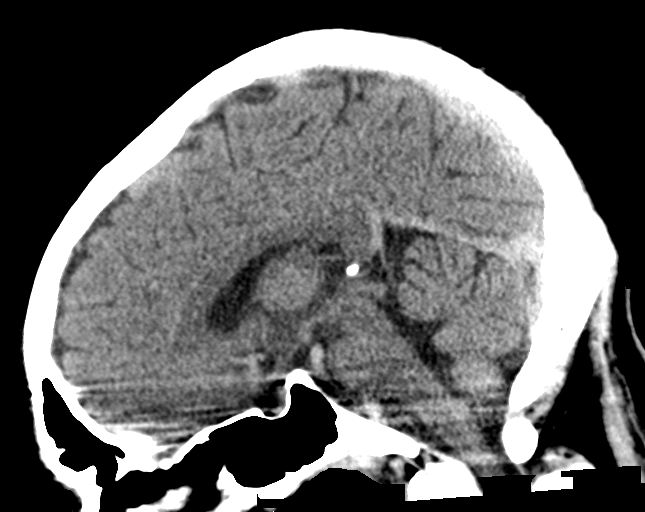
[im 39/58  brain]
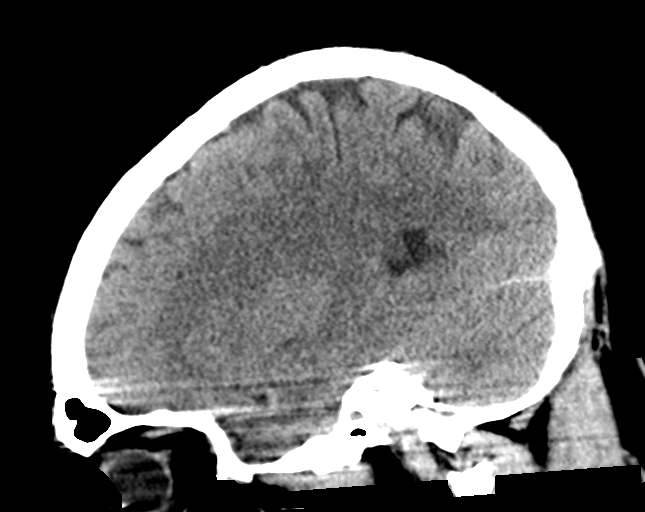

[16 of 47 positions shown; findings below may reference images not displayed]

FINDINGS: Brain: No acute territorial infarction, hemorrhage or intracranial
mass. The ventricles are nonenlarged.

Vascular: No hyperdense vessel or unexpected calcification.

Skull: Normal. Negative for fracture or focal lesion.

Sinuses/Orbits: No acute finding.

Other: None
IMPRESSION: Negative non contrasted CT appearance of the brain.

## 2023-01-27 ENCOUNTER — Telehealth: Payer: Self-pay

## 2023-01-27 ENCOUNTER — Other Ambulatory Visit: Payer: Self-pay

## 2023-01-27 ENCOUNTER — Encounter (HOSPITAL_COMMUNITY): Payer: Self-pay | Admitting: Emergency Medicine

## 2023-01-27 ENCOUNTER — Emergency Department (HOSPITAL_COMMUNITY)
Admission: EM | Admit: 2023-01-27 | Discharge: 2023-01-27 | Disposition: A | Discharge status: Home / Self Care | Payer: Medicaid Other | Attending: Emergency Medicine | Admitting: Emergency Medicine

## 2023-01-27 DIAGNOSIS — R220 Localized swelling, mass and lump, head: Secondary | ICD-10-CM

## 2023-01-27 DIAGNOSIS — K047 Periapical abscess without sinus: Secondary | ICD-10-CM | POA: Insufficient documentation

## 2023-01-27 MED ORDER — AMOXICILLIN-POT CLAVULANATE 875-125 MG PO TABS
1.0000 | ORAL_TABLET | Freq: Once | ORAL | Status: AC
  Administered 2023-01-27: 1 via ORAL
  Filled 2023-01-27: qty 1

## 2023-01-27 MED ORDER — NAPROXEN 250 MG PO TABS
500.0000 mg | ORAL_TABLET | Freq: Once | ORAL | Status: AC
  Administered 2023-01-27: 500 mg via ORAL
  Filled 2023-01-27: qty 2

## 2023-01-27 MED ORDER — AMOXICILLIN-POT CLAVULANATE 875-125 MG PO TABS: 1.0000 | ORAL_TABLET | Freq: Two times a day (BID) | ORAL | 0 refills | Status: DC

## 2023-01-27 MED ORDER — NAPROXEN 500 MG PO TABS: 500.0000 mg | ORAL_TABLET | Freq: Two times a day (BID) | ORAL | 0 refills | Status: DC

## 2023-01-27 NOTE — ED Provider Notes (Signed)
[facility] Provider Note   CSN: [id number] Arrival date & time: 01/27/23  [id number]     History  Chief Complaint  Patient presents with   Facial Swelling    David Patterson is a 27 y.o. male with no significant past medical history who presents to the ED due to right upper dental pain for the past few days associated with right-sided facial edema that started last night.  No difficulty swallowing or breathing.  Denies fever.  Has not seen a dentist in numerous years.  Patient notes he has a chipped right upper posterior molar.  Denies stiff neck.  History obtained from patient and past medical records. No interpreter used during encounter.       Home Medications Prior to Admission medications   Medication Sig Start Date End Date Taking? Authorizing Provider  amoxicillin-clavulanate (AUGMENTIN) 875-125 MG tablet Take 1 tablet by mouth every 12 (twelve) hours. 01/27/23  Yes Leontine Radman C, PA-C  naproxen (NAPROSYN) 500 MG tablet Take 1 tablet (500 mg total) by mouth 2 (two) times daily. 01/27/23  Yes Beverlee Wilmarth C, PA-C  ondansetron (ZOFRAN) 4 MG tablet Take 1 tablet (4 mg total) by mouth every 8 (eight) hours as needed for nausea or vomiting. 10/16/21   Tegeler, Gwenyth Allegra, MD      Allergies    Mango flavor    Review of Systems   Review of Systems  Constitutional:  Negative for fever.  HENT:  Positive for dental problem. Negative for trouble swallowing and voice change.   Respiratory:  Negative for shortness of breath.     Physical Exam Updated Vital Signs BP 135/67   Pulse 82   Temp 98.3 F (36.8 C) (Oral)   Resp 16   Ht 5\' 10"  (1.778 m)   Wt 122.5 kg   SpO2 100%   BMI 38.74 kg/m  Physical Exam Vitals and nursing note reviewed.  Constitutional:      General: He is not in acute distress.    Appearance: He is not ill-appearing.  HENT:     Head: Normocephalic.     Mouth/Throat:     Comments: Chipped right  upper posterior molar. Some right sided facial edema to right cheek, does not extend to eye or lower jaw.  Eyes:     Pupils: Pupils are equal, round, and reactive to light.  Cardiovascular:     Rate and Rhythm: Normal rate and regular rhythm.     Pulses: Normal pulses.     Heart sounds: Normal heart sounds. No murmur heard.    No friction rub. No gallop.  Pulmonary:     Effort: Pulmonary effort is normal.     Breath sounds: Normal breath sounds.     Comments: Airway patent.  Abdominal:     General: Abdomen is flat. There is no distension.     Palpations: Abdomen is soft.     Tenderness: There is no abdominal tenderness. There is no guarding or rebound.  Musculoskeletal:        General: Normal range of motion.     Cervical back: Neck supple.  Skin:    General: Skin is warm and dry.  Neurological:     General: No focal deficit present.     Mental Status: He is alert.  Psychiatric:        Mood and Affect: Mood normal.        Behavior: Behavior normal.     ED Results /  Procedures / Treatments   Labs (all labs ordered are listed, but only abnormal results are displayed) Labs Reviewed - No data to display  EKG None  Radiology No results found.  Procedures Procedures    Medications Ordered in ED Medications  naproxen (NAPROSYN) tablet 500 mg (500 mg Oral Given 01/27/23 0732)  amoxicillin-clavulanate (AUGMENTIN) 875-125 MG per tablet 1 tablet (1 tablet Oral Given 01/27/23 0732)    ED Course/ Medical Decision Making/ A&P                             Medical Decision Making Risk Prescription drug management.   27 year old male presents to the ED due to right-sided dental pain and facial edema that started last night.  Has not seen a dentist in numerous years.  No fever.  Denies trismus, changes to phonation, difficulty swallowing, difficulties breathing.  Upon arrival, vitals all within normal limits.  Patient in no acute distress.  Physical exam significant for chipped  right upper posterior molar.  Right-sided facial edema localized to right cheek which does not extend to eye or lower jaw.  Airway patent.  No evidence of respiratory distress.  No drainable abscess on exam.  No trismus.  Patient tolerating oral secretions without difficulty.  No meningismus to suggest meningitis. Low suspicion for Ludwig's or deep space infection. Patient treated with Augmentin and naproxen here in the ED and discharged with same. Dental resources given at discharge. Strict ED precautions discussed with patient. Patient states understanding and agrees to plan. Patient discharged home in no acute distress and stable vitals  No PCP       Final Clinical Impression(s) / ED Diagnoses Final diagnoses:  Facial swelling  Dental infection    Rx / DC Orders ED Discharge Orders          Ordered    amoxicillin-clavulanate (AUGMENTIN) 875-125 MG tablet  Every 12 hours        01/27/23 0736    naproxen (NAPROSYN) 500 MG tablet  2 times daily        01/27/23 0736              Suzy Bouchard, PA-C 01/27/23 [id number]    Blanchie Dessert, MD 01/27/23 1409

## 2023-01-27 NOTE — ED Triage Notes (Signed)
Pt c/o right sided facial swelling that started x 2 hours ago. Pt states that he thinks it is from his tooth, c/o dental pain as well

## 2023-01-27 NOTE — Telephone Encounter (Signed)
Patient was discharged early morning hours consult  for medication assistance. Patient does have employment listed, uninsured. Called patient to speak to him regarding this, no answer left a confidential message to return call to discuss. With good Rx medications ordered would be 42 dollars.

## 2023-01-27 NOTE — Discharge Instructions (Addendum)
It was a pleasure taking care of you today.  As discussed, I am sending you home with pain medication and an antibiotic.  Take antibiotic as prescribed and finish all antibiotics.  I have included dental resources.  Please call tomorrow to schedule an appointment for further evaluation.  Return to the ER for new or worsening symptoms.

## 2023-02-09 ENCOUNTER — Ambulatory Visit
Admission: EM | Admit: 2023-02-09 | Discharge: 2023-02-09 | Disposition: A | Discharge status: Home / Self Care | Payer: Medicaid Other

## 2023-02-09 DIAGNOSIS — K047 Periapical abscess without sinus: Secondary | ICD-10-CM

## 2023-02-09 MED ORDER — CLINDAMYCIN HCL 150 MG PO CAPS: 450.0000 mg | ORAL_CAPSULE | Freq: Three times a day (TID) | ORAL | 0 refills | Status: AC

## 2023-02-09 NOTE — ED Provider Notes (Signed)
[facility]    CSN: [id number] Arrival date & time: 02/09/23  1437      History   Chief Complaint Chief Complaint  Patient presents with   Dental Pain    HPI David Patterson is a 27 y.o. male.   Patient presents with right upper dental pain that started about 1 month ago.  He was seen at the end of March and prescribed Augmentin antibiotic.  He reports that he saw improvement in symptoms, but when he stopped the antibiotic the symptoms returned.  He has an appointment on 4/15 with dentist.  He denies any fever, body aches, chills.  Denies trauma to the face. He completed the augmentin.    Dental Pain   History reviewed. No pertinent past medical history.  There are no problems to display for this patient.   History reviewed. No pertinent surgical history.     Home Medications    Prior to Admission medications   Medication Sig Start Date End Date Taking? Authorizing Provider  clindamycin (CLEOCIN) 150 MG capsule Take 3 capsules (450 mg total) by mouth every 8 (eight) hours for 5 days. 02/09/23 02/14/23 Yes Rocklin Soderquist, Rolly Salter E, FNP  ibuprofen (ADVIL) 200 MG tablet Take 200 mg by mouth every 6 (six) hours as needed. Last taken: 400mg  around 1145am.   Yes [provider]  naproxen (NAPROSYN) 500 MG tablet Take 1 tablet (500 mg total) by mouth 2 (two) times daily. 01/27/23   Mannie Stabile, PA-C  ondansetron (ZOFRAN) 4 MG tablet Take 1 tablet (4 mg total) by mouth every 8 (eight) hours as needed for nausea or vomiting. 10/16/21   Tegeler, Canary Brim, MD    Family History History reviewed. No pertinent family history.  Social History Social History   Tobacco Use   Smoking status: Never   Smokeless tobacco: Never  Vaping Use   Vaping Use: Never used  Substance Use Topics   Alcohol use: Not Currently   Drug use: Never     Allergies   Mango flavor   Review of Systems Review of Systems Per HPI  Physical Exam Triage Vital Signs ED Triage  Vitals  Enc Vitals Group     BP 02/09/23 1447 125/77     Pulse Rate 02/09/23 1447 77     Resp 02/09/23 1447 20     Temp 02/09/23 1447 98.4 F (36.9 C)     Temp Source 02/09/23 1447 Oral     SpO2 02/09/23 1447 96 %     Weight 02/09/23 1443 265 lb (120.2 kg)     Height 02/09/23 1443 5\' 11"  (1.803 m)     Head Circumference --      Peak Flow --      Pain Score 02/09/23 1443 5     Pain Loc --      Pain Edu? --      Excl. in GC? --    No data found.  Updated Vital Signs BP 125/77 (BP Location: Left Arm)   Pulse 77   Temp 98.4 F (36.9 C) (Oral)   Resp 20   Ht 5\' 11"  (1.803 m)   Wt 265 lb (120.2 kg)   SpO2 96%   BMI 36.96 kg/m   Visual Acuity Right Eye Distance:   Left Eye Distance:   Bilateral Distance:    Right Eye Near:   Left Eye Near:    Bilateral Near:     Physical Exam Constitutional:      General: He  is not in acute distress.    Appearance: Normal appearance. He is not toxic-appearing or diaphoretic.  HENT:     Head: Normocephalic and atraumatic.     Mouth/Throat:     Comments: Patient has pustular like pimple-like lesion present to right upper dentition with surrounding mild gingival swelling and erythema. Eyes:     Extraocular Movements: Extraocular movements intact.     Conjunctiva/sclera: Conjunctivae normal.  Pulmonary:     Effort: Pulmonary effort is normal.  Neurological:     General: No focal deficit present.     Mental Status: He is alert and oriented to person, place, and time. Mental status is at baseline.  Psychiatric:        Mood and Affect: Mood normal.        Behavior: Behavior normal.        Thought Content: Thought content normal.        Judgment: Judgment normal.      UC Treatments / Results  Labs (all labs ordered are listed, but only abnormal results are displayed) Labs Reviewed - No data to display  EKG   Radiology No results found.  Procedures Procedures (including critical care time)  Medications Ordered in  UC Medications - No data to display  Initial Impression / Assessment and Plan / UC Course  I have reviewed the triage vital signs and the nursing notes.  Pertinent labs & imaging results that were available during my care of the patient were reviewed by me and considered in my medical decision making (see chart for details).     Patient has dental infection on exam.  No obvious large abscess that needs drainage.  Will treat with clindamycin given the patient just recently completed Augmentin.  Advised to take this with food.  Discussed supportive care and symptom management.  Advised keeping appointment with the dentist for further evaluation and management.  Patient verbalized understanding and was agreeable with plan. Final Clinical Impressions(s) / UC Diagnoses   Final diagnoses:  Dental infection     Discharge Instructions      I have prescribed a different antibiotic.  Take this with food to avoid stomach upset.  Follow-up with dentist for further evaluation and management.    ED Prescriptions     Medication Sig Dispense Auth. Provider   clindamycin (CLEOCIN) 150 MG capsule Take 3 capsules (450 mg total) by mouth every 8 (eight) hours for 5 days. 45 capsule Hale, Acie Fredrickson, [place]      PDMP not reviewed this encounter.   Gustavus Bryant, [place] 02/09/23 1606

## 2023-02-09 NOTE — Discharge Instructions (Signed)
I have prescribed a different antibiotic.  Take this with food to avoid stomach upset.  Follow-up with dentist for further evaluation and management.

## 2023-02-09 NOTE — ED Triage Notes (Signed)
"  I have swelling on right side of face due to dental pain/abscess". "I was on antibiotics for this about 2 wks ago but dentis appointment is not until the 15th of April 2024". No fever. "I think tooth is still abscessed".

## 2023-03-29 IMAGING — US US SCROTUM W/ DOPPLER COMPLETE
1 series · 14 of 25 positions shown · non-contrast
Comparison: None.

CLINICAL DATA: Testicular pain, left greater than right

EXAM:
SCROTAL ULTRASOUND
DOPPLER ULTRASOUND OF THE TESTICLES
TECHNIQUE: Complete ultrasound examination of the testicles, epididymis, and
other scrotal structures was performed. Color and spectral Doppler
ultrasound were also utilized to evaluate blood flow to the
testicles.

[Series 1: us scrotum w/doppler · 41 acquisitions, 14 frames shown]
[im 1/41]
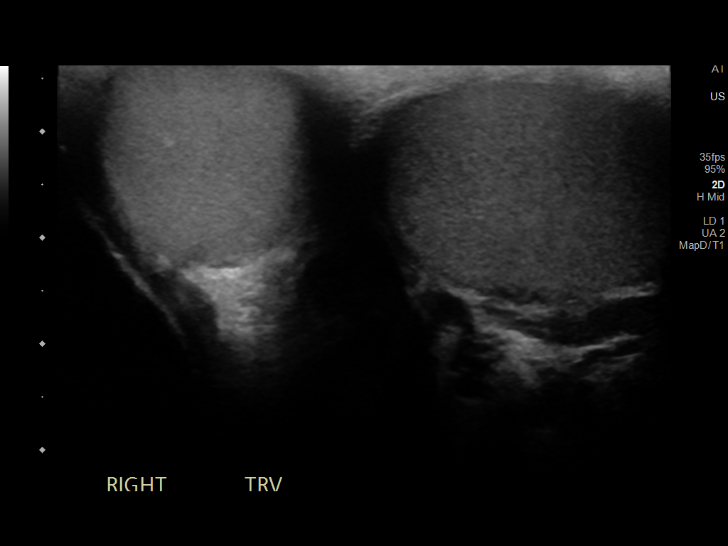
[im 4/41]
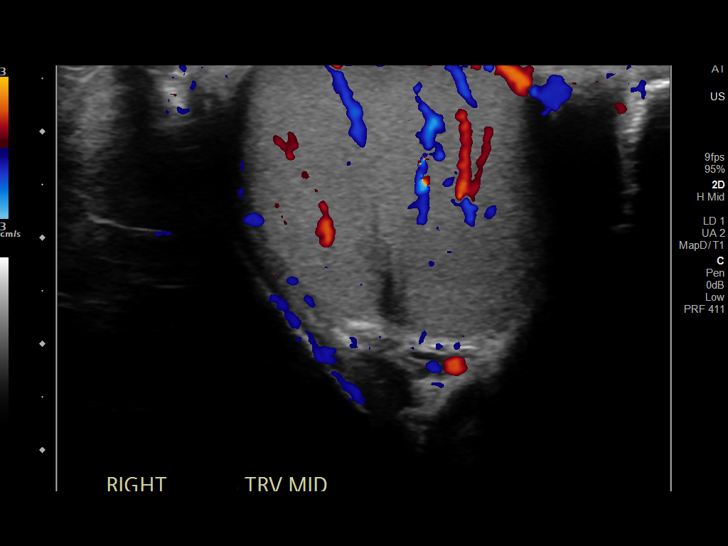
[im 7/41]
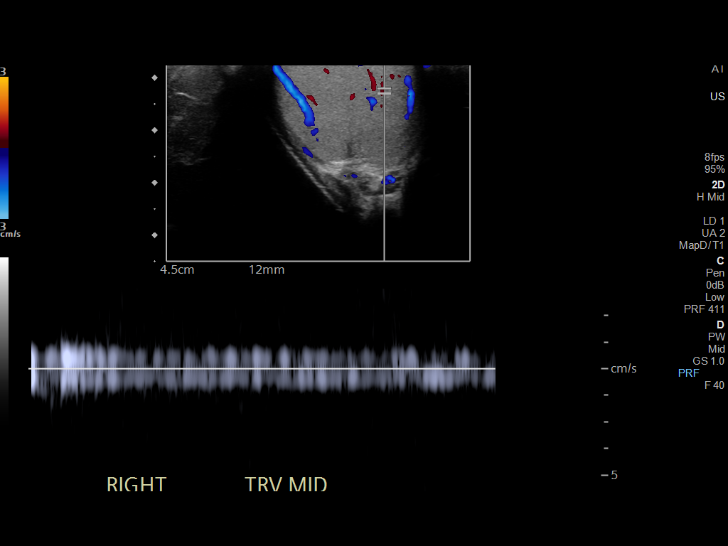
[im 11/41]
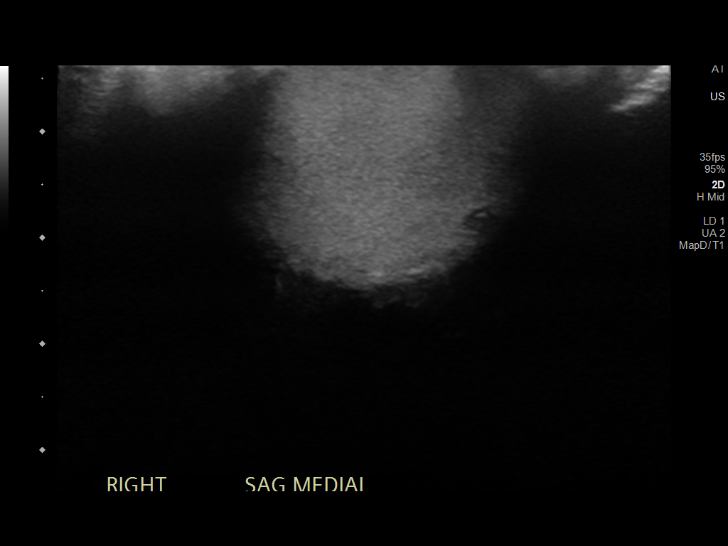
[im 14/41]
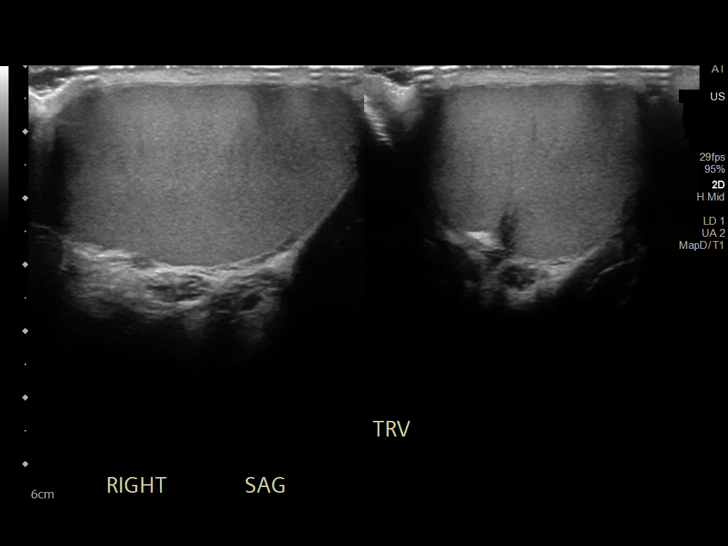
[im 16/41]
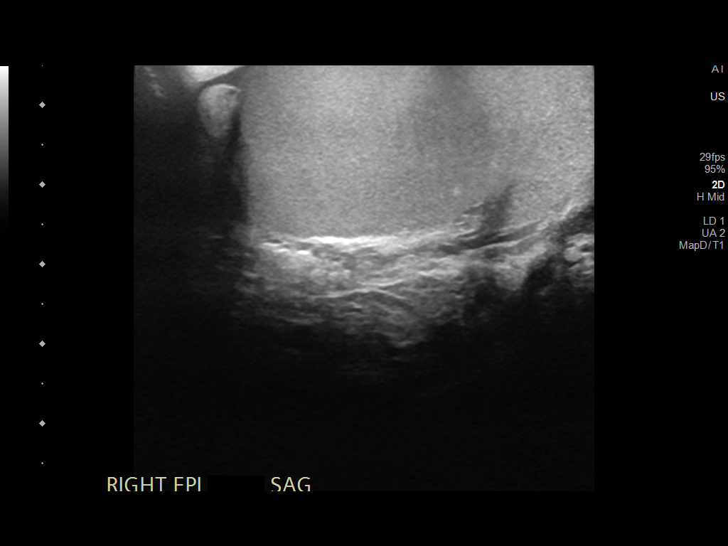
[im 19/41]
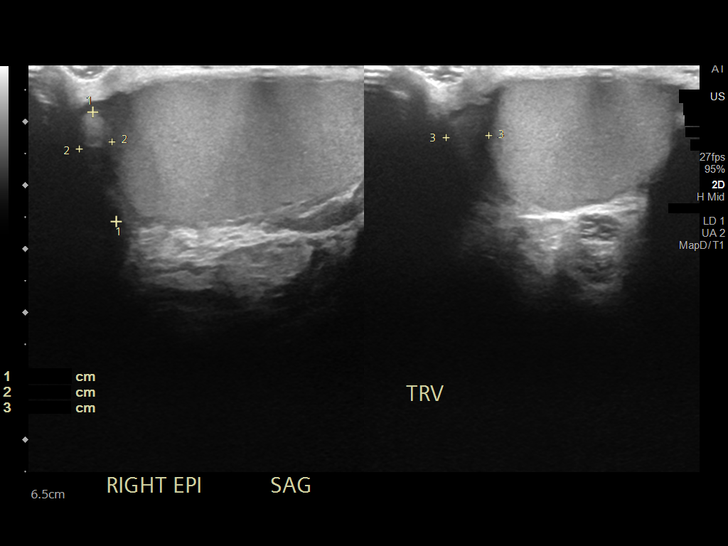
[im 22/41]
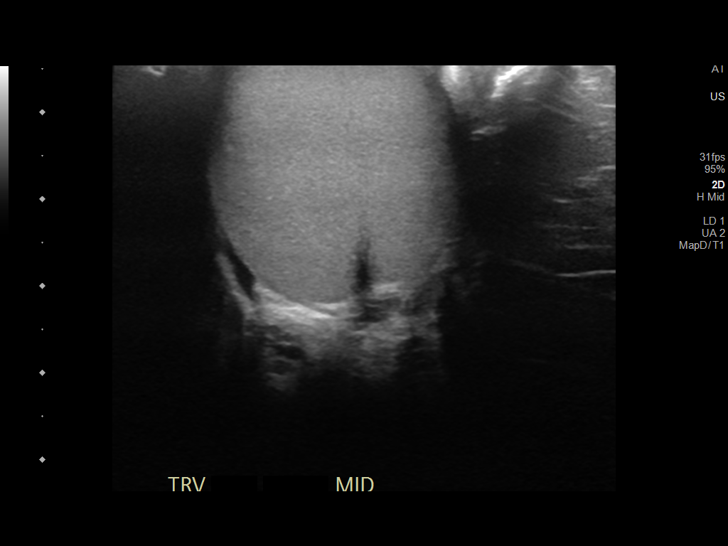
[im 26/41]
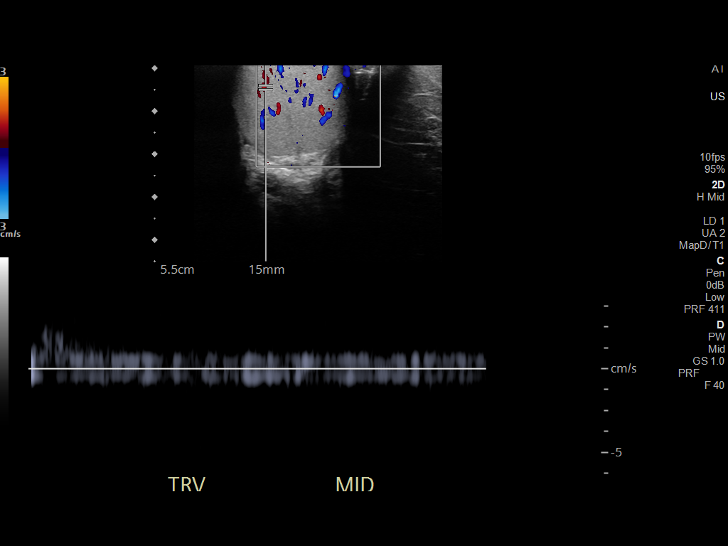
[im 27/41]
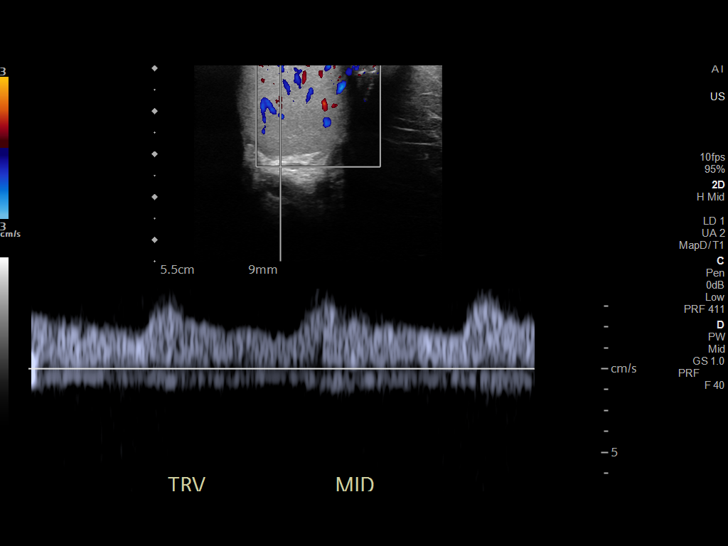
[im 31/41]
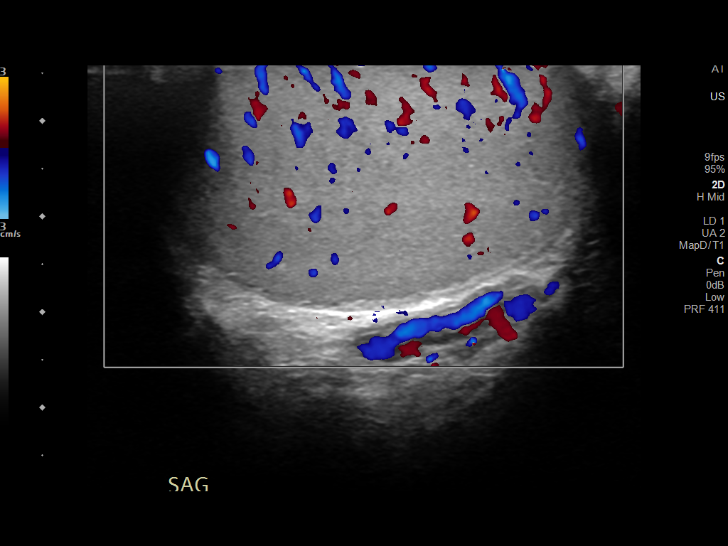
[im 34/41]
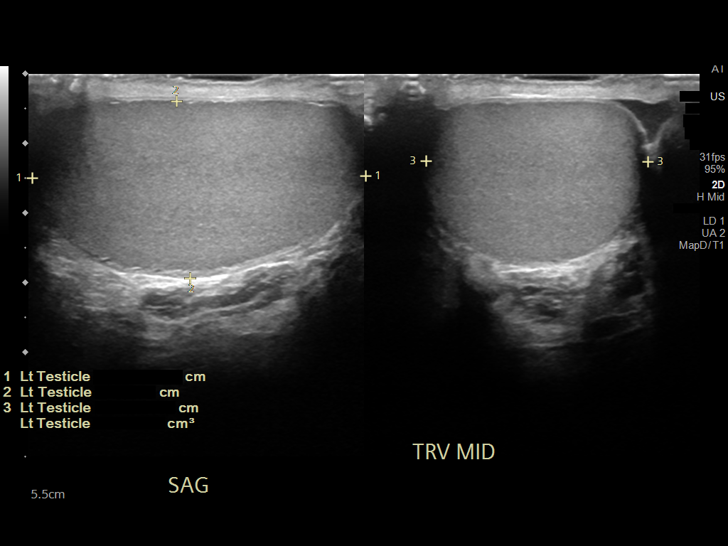
[im 37/41]
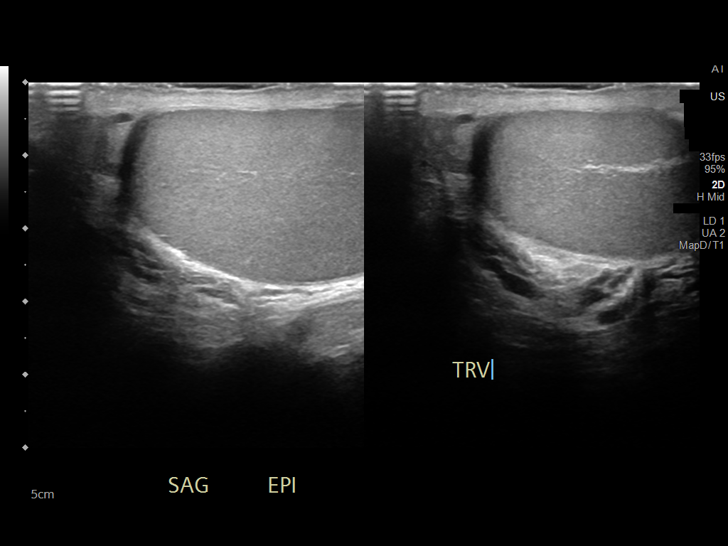
[im 41/41]
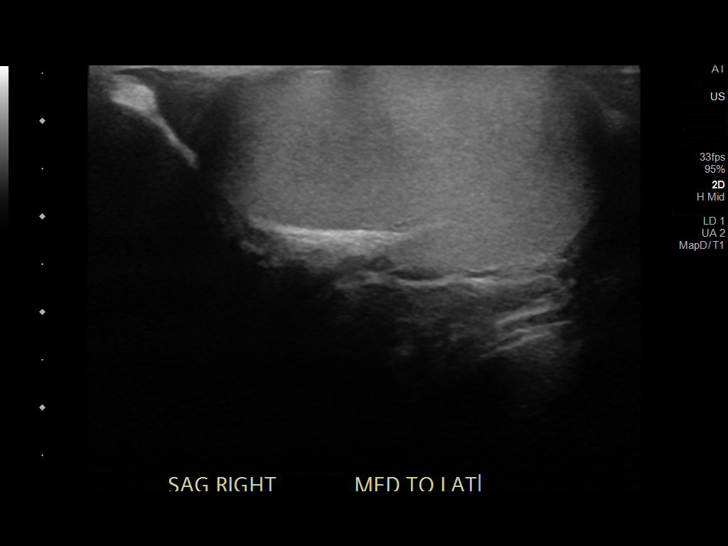

[14 of 25 positions shown; findings below may reference images not displayed]

FINDINGS: Right testicle

Measurements: 5.0 x 2.7 x 3.4 cm. No mass or microlithiasis
visualized.

Left testicle

Measurements: 4.8 x 2.6 x 3.2 cm. No mass or microlithiasis
visualized.

Right epididymis:  Normal in size and appearance.

Left epididymis:  Normal in size and appearance.

Hydrocele:  None visualized.

Varicocele:  None visualized.

Pulsed Doppler interrogation of both testes demonstrates normal low
resistance arterial and venous waveforms bilaterally.
IMPRESSION: No testicular abnormality.  No evidence of torsion.

## 2023-03-29 IMAGING — CT CT ABD-PELV W/ CM
2 of 4 series · 17 of 46 positions shown, 19 images · IV contrast (Omni 300)
Comparison: None.

CLINICAL DATA: Abdominal pain, acute, nonlocalized

EXAM:
CT ABDOMEN AND PELVIS WITH CONTRAST
TECHNIQUE: Multidetector CT imaging of the abdomen and pelvis was performed
using the standard protocol following bolus administration of
intravenous contrast.
CONTRAST:  100mL OMNIPAQUE IOHEXOL 300 MG/ML  SOLN

[Series 3: a/p w/ 5mm · axial · 0.98mm/px · z∈[-1150,-585]mm · 14 of 123 slices shown, 16 images]
[im 5/123  soft-tissue]
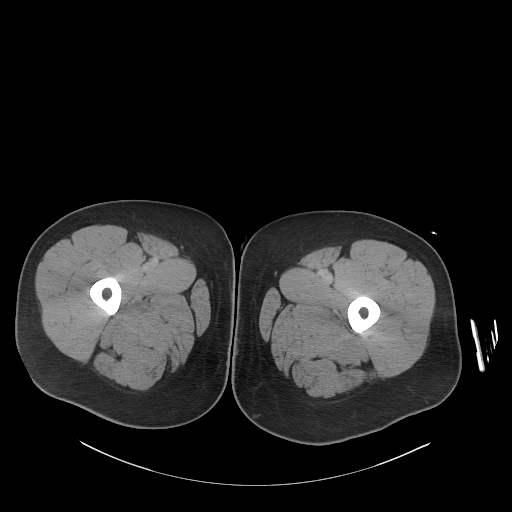
[im 5/123  bone]
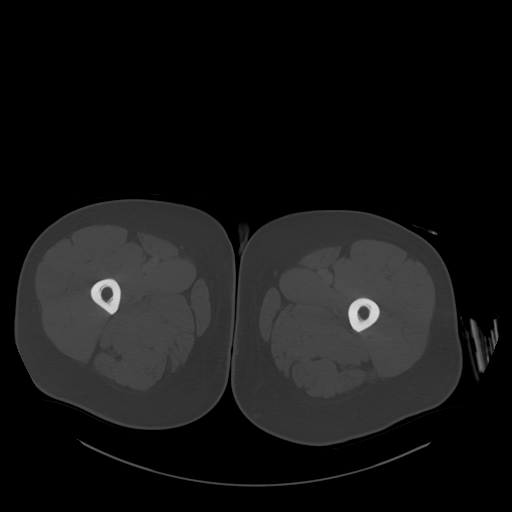
[im 15/123  soft-tissue]
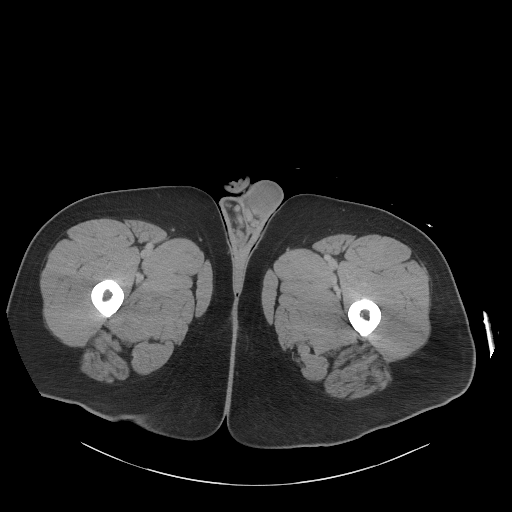
[im 25/123  soft-tissue]
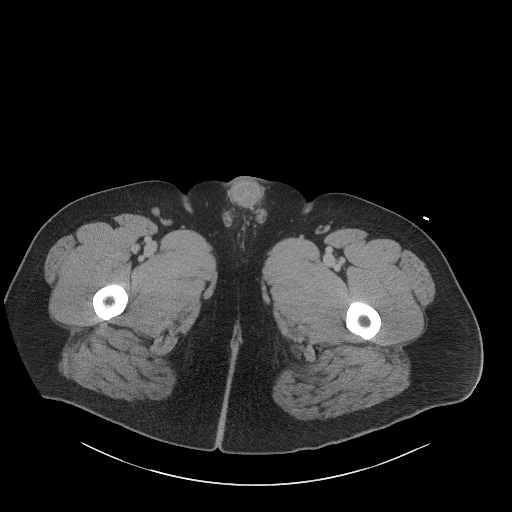
[im 35/123  soft-tissue]
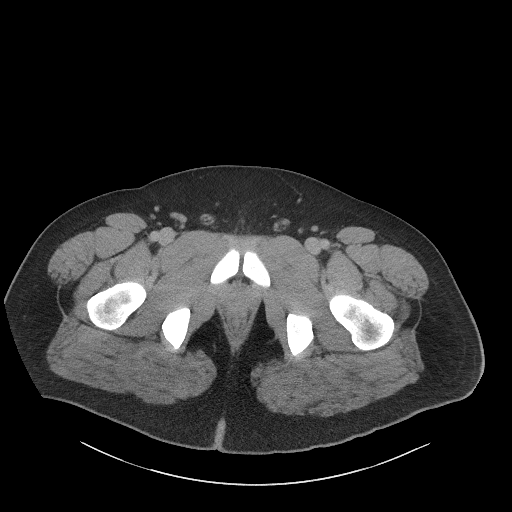
[im 40/123  soft-tissue]
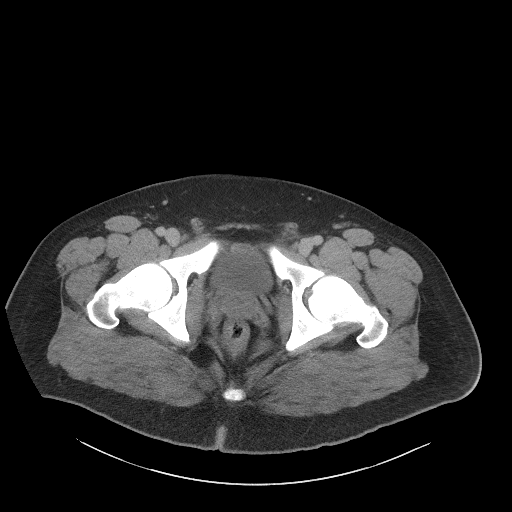
[im 49/123  soft-tissue]
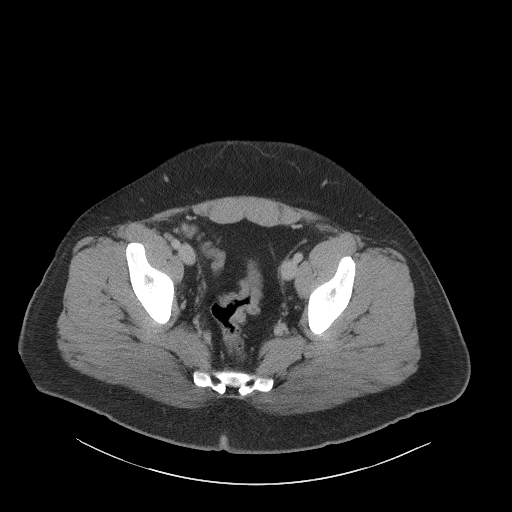
[im 59/123  soft-tissue]
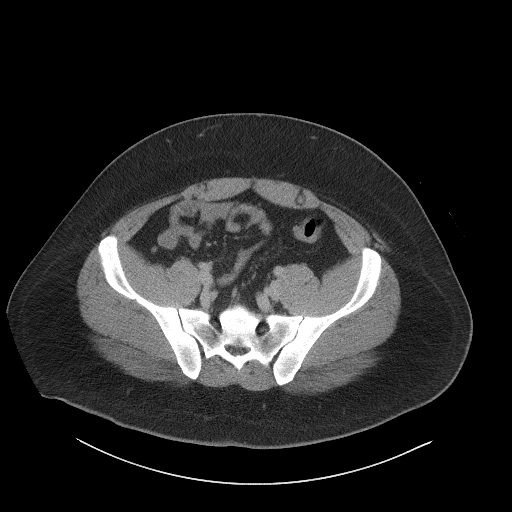
[im 64/123  soft-tissue]
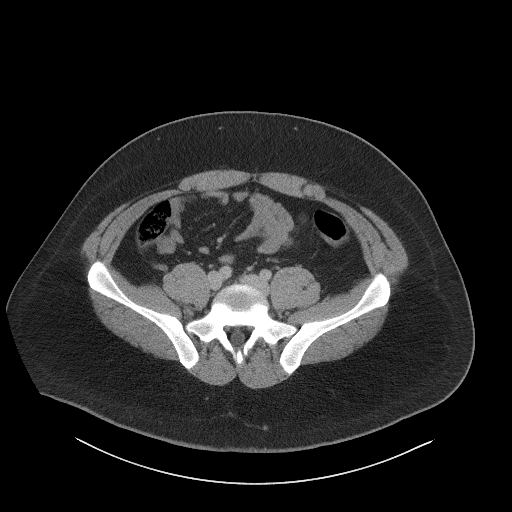
[im 74/123  soft-tissue]
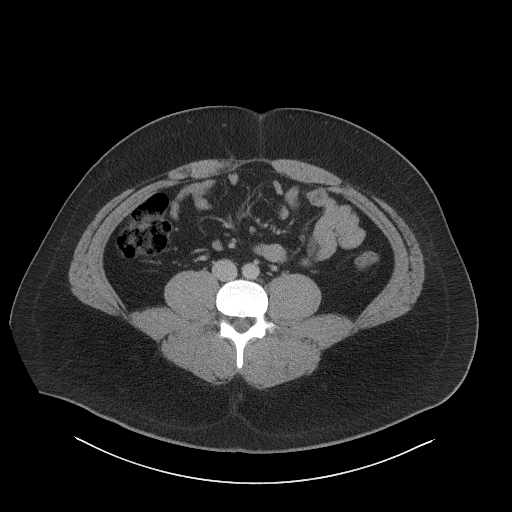
[im 74/123  bone]
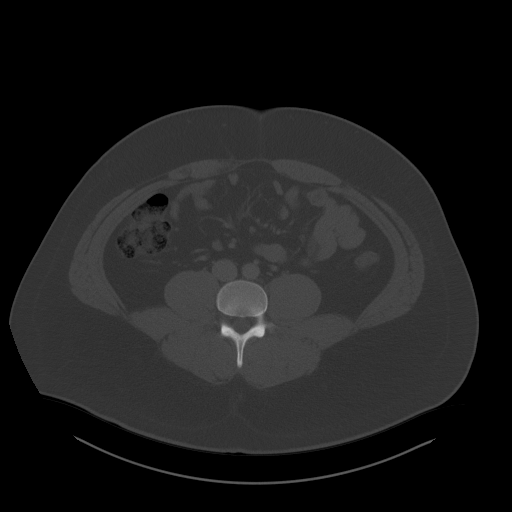
[im 83/123  soft-tissue]
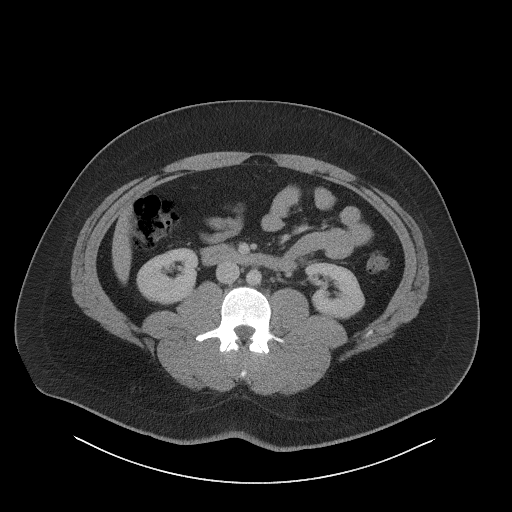
[im 93/123  soft-tissue]
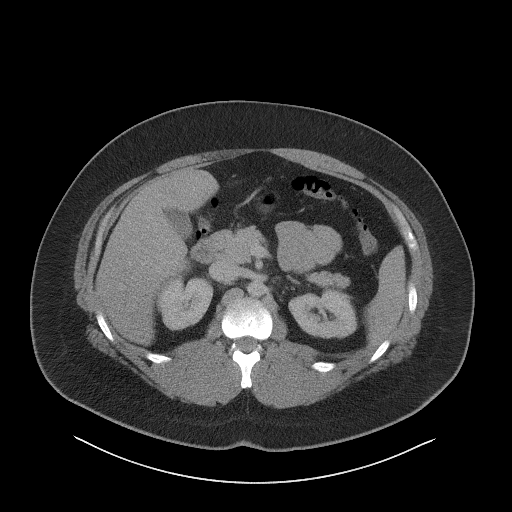
[im 98/123  soft-tissue]
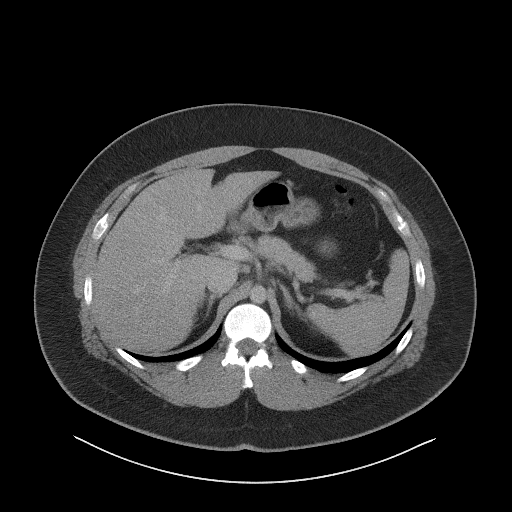
[im 108/123  soft-tissue]
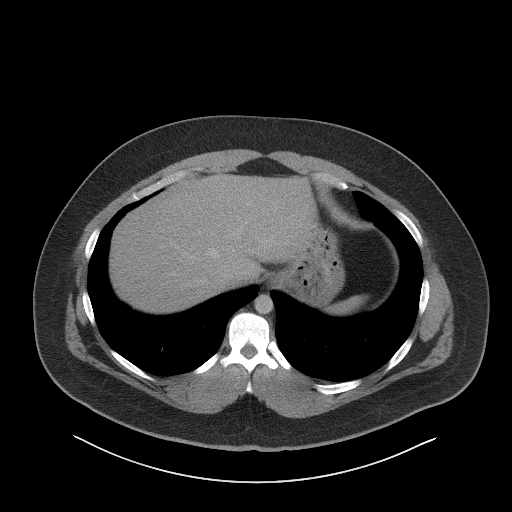
[im 118/123  soft-tissue]
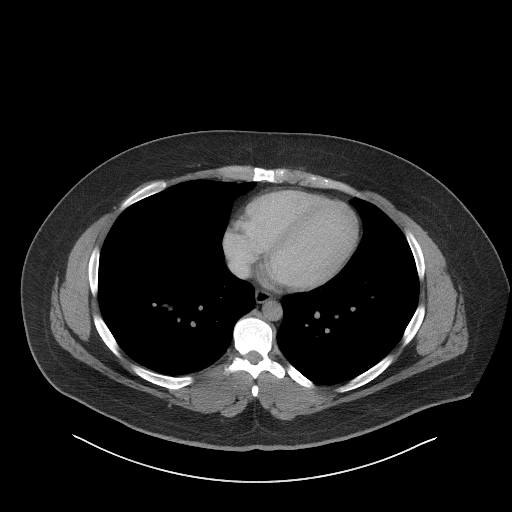

[Series 6: a/p w/ cor · coronal · 1.07mm/px · 3 of 194 slices shown]
[im 65/194  soft-tissue]
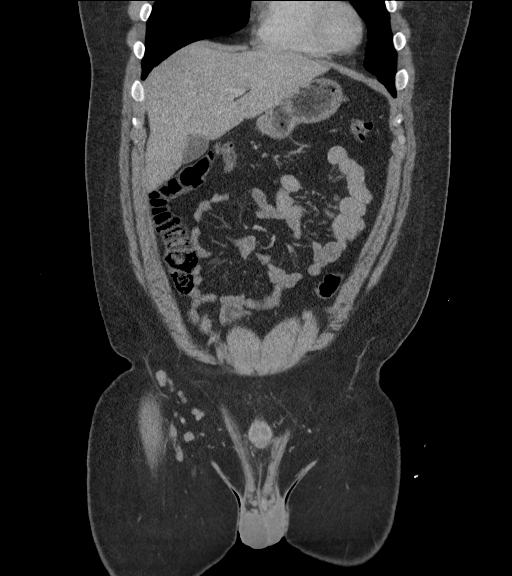
[im 86/194  soft-tissue]
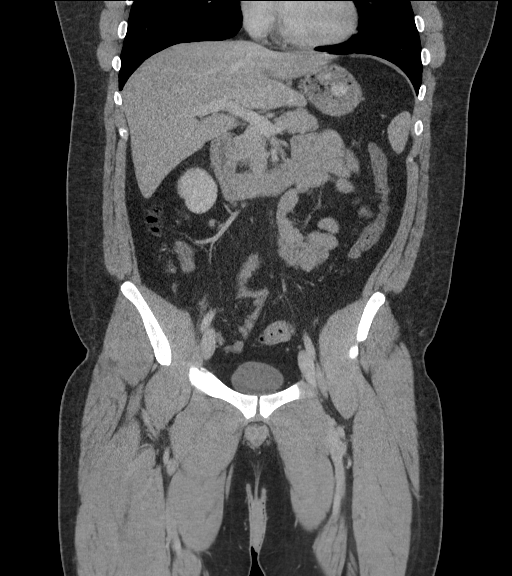
[im 108/194  soft-tissue]
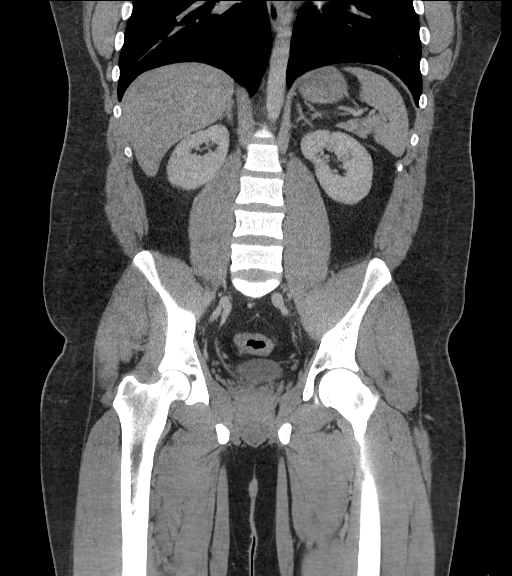

[17 of 46 positions shown; findings below may reference images not displayed]

FINDINGS: Lower chest: No acute abnormality.

Hepatobiliary: No focal liver abnormality is seen. No gallstones,
gallbladder wall thickening, or biliary dilatation.

Pancreas: Unremarkable. No pancreatic ductal dilatation or
surrounding inflammatory changes.

Spleen: Normal in size without focal abnormality.

Adrenals/Urinary Tract: Adrenals, kidneys, and partially distended
bladder are unremarkable.

Stomach/Bowel: Stomach is within normal limits. Bowel is normal in
caliber. Normal appendix.

Vascular/Lymphatic: No significant vascular abnormality on this
noncontrast study. No enlarged lymph nodes.

Reproductive: Unremarkable.

Other: No free fluid.  Abdominal wall is unremarkable.

Musculoskeletal: Chronic bilateral L5 pars breaks without listhesis.
IMPRESSION: No acute abnormality.

## 2023-09-16 ENCOUNTER — Ambulatory Visit
Admission: EM | Admit: 2023-09-16 | Discharge: 2023-09-16 | Disposition: A | Discharge status: Home / Self Care | Payer: Medicaid Other

## 2023-09-16 DIAGNOSIS — B349 Viral infection, unspecified: Secondary | ICD-10-CM

## 2023-09-16 NOTE — ED Provider Notes (Signed)
[facility]    CSN: [id number] Arrival date & time: 09/16/23  1326      History   Chief Complaint No chief complaint on file.   HPI David Patterson is a 27 y.o. male.   Patient presents with approximately 2 to 3-day history of sneezing and "feeling feverish".  He has not taken his temperature with thermometer.  Reports several known sick contacts at work with similar symptoms.  Denies sore throat or cough.  Denies chest pain or shortness of breath.  He has not taken any medication for his symptoms.     History reviewed. No pertinent past medical history.  There are no problems to display for this patient.   History reviewed. No pertinent surgical history.     Home Medications    Prior to Admission medications   Medication Sig Start Date End Date Taking? Authorizing Provider  ibuprofen (ADVIL) 200 MG tablet Take 200 mg by mouth every 6 (six) hours as needed. Last taken: 400mg  around 1145am.    [provider]  naproxen (NAPROSYN) 500 MG tablet Take 1 tablet (500 mg total) by mouth 2 (two) times daily. 01/27/23   Mannie Stabile, PA-C  ondansetron (ZOFRAN) 4 MG tablet Take 1 tablet (4 mg total) by mouth every 8 (eight) hours as needed for nausea or vomiting. 10/16/21   Tegeler, Canary Brim, MD    Family History History reviewed. No pertinent family history.  Social History Social History   Tobacco Use   Smoking status: Never   Smokeless tobacco: Never  Vaping Use   Vaping status: Never Used  Substance Use Topics   Alcohol use: Not Currently   Drug use: Never     Allergies   Mango flavor   Review of Systems Review of Systems Per HPI  Physical Exam Triage Vital Signs ED Triage Vitals  Encounter Vitals Group     BP 09/16/23 1333 130/79     Systolic BP Percentile --      Diastolic BP Percentile --      Pulse Rate 09/16/23 1333 63     Resp 09/16/23 1333 18     Temp 09/16/23 1333 97.9 F (36.6 C)     Temp Source 09/16/23  1333 Oral     SpO2 09/16/23 1333 98 %     Weight --      Height --      Head Circumference --      Peak Flow --      Pain Score 09/16/23 1334 0     Pain Loc --      Pain Education --      Exclude from Growth Chart --    No data found.  Updated Vital Signs BP 130/79 (BP Location: Left Arm)   Pulse 63   Temp 97.9 F (36.6 C) (Oral)   Resp 18   SpO2 98%   Visual Acuity Right Eye Distance:   Left Eye Distance:   Bilateral Distance:    Right Eye Near:   Left Eye Near:    Bilateral Near:     Physical Exam Constitutional:      General: He is not in acute distress.    Appearance: Normal appearance. He is not toxic-appearing or diaphoretic.  HENT:     Head: Normocephalic and atraumatic.     Right Ear: Tympanic membrane and ear canal normal.     Left Ear: Tympanic membrane and ear canal normal.     Nose: Congestion present.  Mouth/Throat:     Mouth: Mucous membranes are moist.     Pharynx: No posterior oropharyngeal erythema.  Eyes:     Extraocular Movements: Extraocular movements intact.     Conjunctiva/sclera: Conjunctivae normal.     Pupils: Pupils are equal, round, and reactive to light.  Cardiovascular:     Rate and Rhythm: Normal rate and regular rhythm.     Pulses: Normal pulses.     Heart sounds: Normal heart sounds.  Pulmonary:     Effort: Pulmonary effort is normal. No respiratory distress.     Breath sounds: Normal breath sounds. No stridor. No wheezing, rhonchi or rales.  Abdominal:     General: Abdomen is flat. Bowel sounds are normal.     Palpations: Abdomen is soft.  Musculoskeletal:        General: Normal range of motion.     Cervical back: Normal range of motion.  Skin:    General: Skin is warm and dry.  Neurological:     General: No focal deficit present.     Mental Status: He is alert and oriented to person, place, and time. Mental status is at baseline.  Psychiatric:        Mood and Affect: Mood normal.        Behavior: Behavior normal.       UC Treatments / Results  Labs (all labs ordered are listed, but only abnormal results are displayed) Labs Reviewed - No data to display  EKG   Radiology No results found.  Procedures Procedures (including critical care time)  Medications Ordered in UC Medications - No data to display  Initial Impression / Assessment and Plan / UC Course  I have reviewed the triage vital signs and the nursing notes.  Pertinent labs & imaging results that were available during my care of the patient were reviewed by me and considered in my medical decision making (see chart for details).     Suspect viral cause to symptoms.  There are no adventitious lung sounds on exam so do not think that chest imaging is necessary.  Patient offered COVID and flu testing but declined.  Do not think strep testing is necessary given appearance of posterior pharynx and patient denying sore throat.  Advised supportive care, symptom management, fluids, rest.  Advised strict follow-up precautions.  Patient verbalized understanding and was agreeable with plan. Final Clinical Impressions(s) / UC Diagnoses   Final diagnoses:  Viral illness     Discharge Instructions      Please follow-up if any symptoms persist or worsen.    ED Prescriptions   None    PDMP not reviewed this encounter.   Gustavus Bryant, [place] 09/16/23 1352

## 2023-09-16 NOTE — Discharge Instructions (Signed)
Please follow-up if any symptoms persist or worsen.

## 2023-09-16 NOTE — ED Triage Notes (Signed)
Pt reports he felt like he had a fever x 1 day

## 2023-12-20 ENCOUNTER — Ambulatory Visit
Admission: EM | Admit: 2023-12-20 | Discharge: 2023-12-20 | Disposition: A | Discharge status: Home / Self Care | Payer: Self-pay

## 2023-12-20 DIAGNOSIS — R0981 Nasal congestion: Secondary | ICD-10-CM

## 2023-12-20 LAB — POC COVID19/FLU A&B COMBO
Covid Antigen, POC: NEGATIVE
Influenza A Antigen, POC: NEGATIVE
Influenza B Antigen, POC: NEGATIVE

## 2023-12-20 NOTE — ED Provider Notes (Signed)
[facility]    CSN: [id number] Arrival date & time: 12/20/23  1509      History   Chief Complaint Chief Complaint  Patient presents with   Sinus Problem    HPI David Patterson is a 28 y.o. male.   Patient here today for evaluation of nasal congestion and drainage that started yesterday. He has not had any vomiting or diarrhea. He denies fever. 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   The history is provided by the patient.  Sinus Problem Pertinent negatives include no abdominal pain and no shortness of breath.    History reviewed. No pertinent past medical history.  There are no active problems to display for this patient.   History reviewed. No pertinent surgical history.     Home Medications    Prior to Admission medications   Medication Sig Start Date End Date Taking? Authorizing Provider  amoxicillin (AMOXIL) 500 MG tablet Take 500 mg by mouth 3 (three) times daily. 07/05/23  Yes [provider]  ibuprofen (ADVIL) 200 MG tablet Take 200 mg by mouth every 6 (six) hours as needed. Last taken: 400mg  around 1145am.    [provider]  naproxen (NAPROSYN) 500 MG tablet Take 1 tablet (500 mg total) by mouth 2 (two) times daily. 01/27/23   Mannie Stabile, PA-C  ondansetron (ZOFRAN) 4 MG tablet Take 1 tablet (4 mg total) by mouth every 8 (eight) hours as needed for nausea or vomiting. 10/16/21   Tegeler, Canary Brim, MD    Family History History reviewed. No pertinent family history.  Social History Social History   Tobacco Use   Smoking status: Never    Passive exposure: Never   Smokeless tobacco: Never  Vaping Use   Vaping status: Never Used  Substance Use Topics   Alcohol use: Not Currently   Drug use: Never     Allergies   Mango flavoring agent (non-screening)   Review of Systems Review of Systems  Constitutional:  Negative for chills and fever.  HENT:  Positive for  congestion and sore throat. Negative for ear pain.   Eyes:  Negative for discharge and redness.  Respiratory:  Negative for cough and shortness of breath.   Gastrointestinal:  Negative for abdominal pain, nausea and vomiting.     Physical Exam Triage Vital Signs ED Triage Vitals  Encounter Vitals Group     BP 12/20/23 1603 125/72     Systolic BP Percentile --      Diastolic BP Percentile --      Pulse Rate 12/20/23 1603 78     Resp 12/20/23 1603 20     Temp 12/20/23 1603 98.7 F (37.1 C)     Temp Source 12/20/23 1603 Oral     SpO2 12/20/23 1603 97 %     Weight 12/20/23 1601 265 lb (120.2 kg)     Height 12/20/23 1601 5\' 11"  (1.803 m)     Head Circumference --      Peak Flow --      Pain Score 12/20/23 1556 1     Pain Loc --      Pain Education --      Exclude from Growth Chart --    No data found.  Updated Vital Signs BP 125/72 (BP Location: Left Arm)   Pulse 78   Temp 98.7 F (37.1 C) (Oral)   Resp 20   Ht 5\' 11"  (1.803 m)   Wt 265  lb (120.2 kg)   SpO2 97%   BMI 36.96 kg/m   Visual Acuity Right Eye Distance:   Left Eye Distance:   Bilateral Distance:    Right Eye Near:   Left Eye Near:    Bilateral Near:     Physical Exam Vitals and nursing note reviewed.  Constitutional:      General: He is not in acute distress.    Appearance: Normal appearance. He is not ill-appearing.  HENT:     Head: Normocephalic and atraumatic.     Right Ear: Tympanic membrane normal.     Left Ear: Tympanic membrane normal.     Nose: Congestion present.     Mouth/Throat:     Mouth: Mucous membranes are moist.     Pharynx: Oropharynx is clear. No oropharyngeal exudate or posterior oropharyngeal erythema.  Eyes:     Conjunctiva/sclera: Conjunctivae normal.  Cardiovascular:     Rate and Rhythm: Normal rate and regular rhythm.     Heart sounds: Normal heart sounds. No murmur heard. Pulmonary:     Effort: Pulmonary effort is normal. No respiratory distress.     Breath sounds:  Normal breath sounds. No wheezing, rhonchi or rales.  Skin:    General: Skin is warm and dry.  Neurological:     Mental Status: He is alert.  Psychiatric:        Mood and Affect: Mood normal.        Thought Content: Thought content normal.      UC Treatments / Results  Labs (all labs ordered are listed, but only abnormal results are displayed) Labs Reviewed  POC COVID19/FLU A&B COMBO - Normal    EKG   Radiology No results found.  Procedures Procedures (including critical care time)  Medications Ordered in UC Medications - No data to display  Initial Impression / Assessment and Plan / UC Course  I have reviewed the triage vital signs and the nursing notes.  Pertinent labs & imaging results that were available during my care of the patient were reviewed by me and considered in my medical decision making (see chart for details).    Flu and covid screening negative. Recommended symptomatic treatment with follow up if no gradual improvement or with any worsening symptoms.   Final Clinical Impressions(s) / UC Diagnoses   Final diagnoses:  Nasal congestion   Discharge Instructions   None    ED Prescriptions   None    PDMP not reviewed this encounter.   Tomi Bamberger, PA-C 12/20/23 1819

## 2023-12-20 NOTE — ED Triage Notes (Signed)
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 No fever. No cough.

## 2024-04-29 ENCOUNTER — Ambulatory Visit
Admission: EM | Admit: 2024-04-29 | Discharge: 2024-04-29 | Disposition: A | Discharge status: Home / Self Care | Payer: Self-pay | Attending: Family Medicine | Admitting: Family Medicine

## 2024-04-29 DIAGNOSIS — R509 Fever, unspecified: Secondary | ICD-10-CM

## 2024-04-29 NOTE — Discharge Instructions (Addendum)
 We will manage this as a viral illness. For sore throat or cough try using a honey-based tea. Use 3 teaspoons of honey with juice squeezed from half lemon. Place shaved pieces of ginger into 1/2-1 cup of water and warm over stove top. Then mix the ingredients and repeat every 4 hours as needed. Please take ibuprofen 600mg  every 6 hours with food alternating with OR taken together with Tylenol 500mg -650mg  every 6 hours for throat pain, fevers, aches and pains. Hydrate very well with at least 2 liters of water. Eat light meals such as soups (chicken and noodles, vegetable, chicken and wild rice).  Do not eat foods that you are allergic to.  Taking an antihistamine like Zyrtec (10mg  daily) can help against postnasal drainage, sinus congestion which can cause sinus pain, sinus headaches, throat pain, painful swallowing, coughing.  You can take this together with pseudoephedrine (Sudafed) at a dose of 30mg  3 times a day or twice daily as needed for the same kind of nasal drip, congestion.

## 2024-04-29 NOTE — ED Provider Notes (Signed)
 [facility]  Note:  This document was prepared using Conservation officer, historic buildings and may include unintentional dictation errors.  MRN: [id number] DOB: [birth year]  Subjective:   David Patterson is a 28 y.o. male presenting for 1 day history of subjective fever. His son also had a fever. No sinus symptoms, ear pain, cough, chest pain, shob, n/v, abdominal pain, urinary symptoms. No rashes.   No current facility-administered medications for this encounter.  Current Outpatient Medications:    amoxicillin  (AMOXIL ) 500 MG tablet, Take 500 mg by mouth 3 (three) times daily., Disp: , Rfl:    ibuprofen  (ADVIL ) 200 MG tablet, Take 200 mg by mouth every 6 (six) hours as needed. Last taken: 400mg  around 1145am., Disp: , Rfl:    naproxen  (NAPROSYN ) 500 MG tablet, Take 1 tablet (500 mg total) by mouth 2 (two) times daily., Disp: 30 tablet, Rfl: 0   ondansetron  (ZOFRAN ) 4 MG tablet, Take 1 tablet (4 mg total) by mouth every 8 (eight) hours as needed for nausea or vomiting., Disp: 12 tablet, Rfl: 0   Allergies  Allergen Reactions   Mango Flavoring Agent (Non-Screening) Swelling    Mango and Mango Flavoring. Breakouts on face and facial swelling.     History reviewed. No pertinent past medical history.   History reviewed. No pertinent surgical history.  History reviewed. No pertinent family history.  Social History   Tobacco Use   Smoking status: Never    Passive exposure: Never   Smokeless tobacco: Never  Vaping Use   Vaping status: Never Used  Substance Use Topics   Alcohol use: Not Currently   Drug use: Never    ROS   Objective:   Vitals: BP (!) 140/91 (BP Location: Left Arm)   Pulse 70   Temp 99 F (37.2 C) (Oral)   Resp 16   SpO2 97%   BP Readings from Last 3 Encounters:  04/29/24 (!) 140/91  12/20/23 125/72  09/16/23 130/79     Physical Exam Constitutional:      General: He is not in acute distress.    Appearance: Normal appearance.  He is well-developed and normal weight. He is not ill-appearing, toxic-appearing or diaphoretic.  HENT:     Head: Normocephalic and atraumatic.     Right Ear: External ear normal.     Left Ear: External ear normal.     Nose: Nose normal.     Mouth/Throat:     Mouth: Mucous membranes are moist.     Pharynx: No pharyngeal swelling, oropharyngeal exudate, posterior oropharyngeal erythema or uvula swelling.     Tonsils: No tonsillar exudate or tonsillar abscesses. 0 on the right. 0 on the left.   Eyes:     General: No scleral icterus.       Right eye: No discharge.        Left eye: No discharge.     Extraocular Movements: Extraocular movements intact.    Cardiovascular:     Rate and Rhythm: Normal rate and regular rhythm.     Heart sounds: Normal heart sounds. No murmur heard.    No friction rub. No gallop.  Pulmonary:     Effort: Pulmonary effort is normal. No respiratory distress.     Breath sounds: Normal breath sounds. No stridor. No wheezing, rhonchi or rales.   Musculoskeletal:     Cervical back: Normal range of motion.   Neurological:     Mental Status: He is alert and oriented to person, place, and  time.   Psychiatric:        Mood and Affect: Mood normal.        Behavior: Behavior normal.        Thought Content: Thought content normal.        Judgment: Judgment normal.     Assessment and Plan :   PDMP not reviewed this encounter.  1. Fever, unspecified    Patient declined testing. Suspect viral illness, viral syndrome. Physical exam findings reassuring and vital signs stable for discharge. Advised supportive care, offered symptomatic relief. Counseled patient on potential for adverse effects with medications prescribed/recommended today, ER and return-to-clinic precautions discussed, patient verbalized understanding.     Christopher Savannah, [place] 04/29/24 8091

## 2024-04-29 NOTE — ED Triage Notes (Signed)
 Pt states fever since last night. Denies any symptoms.

## 2024-06-23 ENCOUNTER — Ambulatory Visit
Admission: EM | Admit: 2024-06-23 | Discharge: 2024-06-23 | Disposition: A | Discharge status: Home / Self Care | Payer: Self-pay | Attending: Family Medicine | Admitting: Family Medicine

## 2024-06-23 DIAGNOSIS — Z0289 Encounter for other administrative examinations: Secondary | ICD-10-CM

## 2024-06-23 NOTE — ED Triage Notes (Signed)
 Pt present with fever x yesterday. States he has not taken anything at home for fever relief. Reports he only had a fever this morning. Pt is requesting a note to go back to work.

## 2024-06-23 NOTE — ED Provider Notes (Signed)
 [facility]    CSN: [id number] Arrival date & time: 06/23/24  1458      History   Chief Complaint Chief Complaint  Patient presents with   Fever    HPI David Patterson is a 28 y.o. male  presents for evaluation of URI symptoms for 1 days. Patient reports associated symptoms of subjective fever last night. Denies N/V/D, cough, congestion, sore throat, ear pain, body aches, shortness of breath, documented fevers.  States he has had no subjective fevers or other symptoms today and feels well but needs a note to return to work.  Pt has no other concerns at this time.    Fever   History reviewed. No pertinent past medical history.  There are no active problems to display for this patient.   History reviewed. No pertinent surgical history.     Home Medications    Prior to Admission medications   Medication Sig Start Date End Date Taking? Authorizing Provider  amoxicillin  (AMOXIL ) 500 MG tablet Take 500 mg by mouth 3 (three) times daily. 07/05/23   [provider]  ibuprofen  (ADVIL ) 200 MG tablet Take 200 mg by mouth every 6 (six) hours as needed. Last taken: 400mg  around 1145am.    [provider]  naproxen  (NAPROSYN ) 500 MG tablet Take 1 tablet (500 mg total) by mouth 2 (two) times daily. 01/27/23   Aberman, Caroline C, PA-C  ondansetron  (ZOFRAN ) 4 MG tablet Take 1 tablet (4 mg total) by mouth every 8 (eight) hours as needed for nausea or vomiting. 10/16/21   Tegeler, Lonni PARAS, MD    Family History History reviewed. No pertinent family history.  Social History Social History   Tobacco Use   Smoking status: Never    Passive exposure: Never   Smokeless tobacco: Never  Vaping Use   Vaping status: Never Used  Substance Use Topics   Alcohol use: Not Currently   Drug use: Never     Allergies   Mango flavoring agent (non-screening)   Review of Systems Review of Systems  Constitutional:  Positive for fever.     Physical  Exam Triage Vital Signs ED Triage Vitals  Encounter Vitals Group     BP 06/23/24 1512 128/80     Girls Systolic BP Percentile --      Girls Diastolic BP Percentile --      Boys Systolic BP Percentile --      Boys Diastolic BP Percentile --      Pulse Rate 06/23/24 1512 80     Resp 06/23/24 1512 17     Temp 06/23/24 1512 98.3 F (36.8 C)     Temp src --      SpO2 06/23/24 1512 93 %     Weight --      Height --      Head Circumference --      Peak Flow --      Pain Score 06/23/24 1511 0     Pain Loc --      Pain Education --      Exclude from Growth Chart --    No data found.  Updated Vital Signs BP 128/80 (BP Location: Right Arm)   Pulse 80   Temp 98.3 F (36.8 C)   Resp 17   SpO2 93%   Visual Acuity Right Eye Distance:   Left Eye Distance:   Bilateral Distance:    Right Eye Near:   Left Eye Near:    Bilateral Near:  Physical Exam Vitals and nursing note reviewed.  Constitutional:      General: He is not in acute distress.    Appearance: Normal appearance. He is not ill-appearing or toxic-appearing.  HENT:     Head: Normocephalic and atraumatic.     Nose: No congestion.     Mouth/Throat:     Mouth: Mucous membranes are moist.     Pharynx: No posterior oropharyngeal erythema.  Eyes:     Pupils: Pupils are equal, round, and reactive to light.  Cardiovascular:     Rate and Rhythm: Normal rate and regular rhythm.     Heart sounds: Normal heart sounds.  Pulmonary:     Effort: Pulmonary effort is normal.     Breath sounds: Normal breath sounds.  Musculoskeletal:     Cervical back: Normal range of motion and neck supple.  Lymphadenopathy:     Cervical: No cervical adenopathy.  Skin:    General: Skin is warm and dry.  Neurological:     General: No focal deficit present.     Mental Status: He is alert and oriented to person, place, and time.  Psychiatric:        Mood and Affect: Mood normal.        Behavior: Behavior normal.      UC Treatments /  Results  Labs (all labs ordered are listed, but only abnormal results are displayed) Labs Reviewed - No data to display  EKG   Radiology No results found.  Procedures Procedures (including critical care time)  Medications Ordered in UC Medications - No data to display  Initial Impression / Assessment and Plan / UC Course  I have reviewed the triage vital signs and the nursing notes.  Pertinent labs & imaging results that were available during my care of the patient were reviewed by me and considered in my medical decision making (see chart for details).     Patient presenting for work note.  He is currently asymptomatic, well-appearing with stable vital signs.  Work note provided.  Patient to follow-up with PCP as needed. Final Clinical Impressions(s) / UC Diagnoses   Final diagnoses:  Encounter to obtain excuse from work   Discharge Instructions   None    ED Prescriptions   None    PDMP not reviewed this encounter.   Loreda Myla SAUNDERS, NP 06/23/24 1525

## 2024-08-01 ENCOUNTER — Ambulatory Visit
Admission: EM | Admit: 2024-08-01 | Discharge: 2024-08-01 | Disposition: A | Discharge status: Home / Self Care | Payer: Self-pay | Attending: Family Medicine | Admitting: Family Medicine

## 2024-08-01 DIAGNOSIS — J988 Other specified respiratory disorders: Secondary | ICD-10-CM

## 2024-08-01 DIAGNOSIS — B9789 Other viral agents as the cause of diseases classified elsewhere: Secondary | ICD-10-CM

## 2024-08-01 MED ORDER — PSEUDOEPHEDRINE HCL 60 MG PO TABS: 60.0000 mg | ORAL_TABLET | Freq: Three times a day (TID) | ORAL | 0 refills | Status: AC | PRN

## 2024-08-01 MED ORDER — CETIRIZINE HCL 10 MG PO TABS: 10.0000 mg | ORAL_TABLET | Freq: Every day | ORAL | 0 refills | Status: AC

## 2024-08-01 NOTE — Discharge Instructions (Addendum)
We will manage this as a viral respiratory illness. For sore throat or cough try using a honey-based tea. Use 3 teaspoons of honey with juice squeezed from half lemon. Place shaved pieces of ginger into 1/2-1 cup of water and warm over stove top. Then mix the ingredients and repeat every 4 hours as needed. Please take ibuprofen 600mg  every 6 hours with food alternating with OR taken together with Tylenol 500mg -650mg  every 6 hours for throat pain, fevers, aches and pains. Hydrate very well with at least 2 liters of water. Eat light meals such as soups (chicken and noodles, vegetable, chicken and wild rice).  Do not eat foods that you are allergic to.  Taking an antihistamine like Zyrtec (10mg  daily) can help against postnasal drainage, sinus congestion which can cause sinus pain, sinus headaches, throat pain, painful swallowing, coughing.  You can take this together with pseudoephedrine (Sudafed) at a dose of 60 mg 3 times a day or twice daily as needed for the same kind of nasal drip, congestion.

## 2024-08-01 NOTE — ED Provider Notes (Signed)
 [facility]  Note:  This document was prepared using Conservation officer, historic buildings and may include unintentional dictation errors.  MRN: [id number] DOB: 1996/01/10  Subjective:   David Patterson is a 28 y.o. male presenting for 1 day history of fever, sinus congestion, drainage.  Ibuprofen  did help.  He did a COVID test yesterday and was negative.  No throat pain, ear pain, chest pain, shortness of breath or wheezing.  No GI symptoms.  No rashes.  No asthma.  No smoking of any kind including cigarettes, cigars, vaping, marijuana use.    No chronic medications.  Allergies  Allergen Reactions   Mango Flavoring Agent (Non-Screening) Swelling    Mango and Mango Flavoring. Breakouts on face and facial swelling.     History reviewed. No pertinent past medical history.   History reviewed. No pertinent surgical history.  History reviewed. No pertinent family history.  Social History   Tobacco Use   Smoking status: Never    Passive exposure: Never   Smokeless tobacco: Never  Vaping Use   Vaping status: Never Used  Substance Use Topics   Alcohol use: Not Currently   Drug use: Never    ROS   Objective:   Vitals: BP (!) 142/92 (BP Location: Left Arm)   Pulse 71   Temp (!) 97 F (36.1 C) (Oral)   Resp 18   SpO2 97%   Physical Exam Constitutional:      General: He is not in acute distress.    Appearance: Normal appearance. He is well-developed and normal weight. He is not ill-appearing, toxic-appearing or diaphoretic.  HENT:     Head: Normocephalic and atraumatic.     Right Ear: Tympanic membrane, ear canal and external ear normal. No drainage, swelling or tenderness. No middle ear effusion. There is no impacted cerumen. Tympanic membrane is not erythematous or bulging.     Left Ear: Tympanic membrane, ear canal and external ear normal. No drainage, swelling or tenderness.  No middle ear effusion. There is no impacted cerumen. Tympanic membrane  is not erythematous or bulging.     Nose: Nose normal. No congestion or rhinorrhea.     Mouth/Throat:     Mouth: Mucous membranes are moist.     Pharynx: No oropharyngeal exudate or posterior oropharyngeal erythema.  Eyes:     General: No scleral icterus.       Right eye: No discharge.        Left eye: No discharge.     Extraocular Movements: Extraocular movements intact.     Conjunctiva/sclera: Conjunctivae normal.  Cardiovascular:     Rate and Rhythm: Normal rate and regular rhythm.     Heart sounds: Normal heart sounds. No murmur heard.    No friction rub. No gallop.  Pulmonary:     Effort: Pulmonary effort is normal. No respiratory distress.     Breath sounds: Normal breath sounds. No stridor. No wheezing, rhonchi or rales.  Musculoskeletal:     Cervical back: Normal range of motion and neck supple. No rigidity. No muscular tenderness.  Neurological:     General: No focal deficit present.     Mental Status: He is alert and oriented to person, place, and time.  Psychiatric:        Mood and Affect: Mood normal.        Behavior: Behavior normal.        Thought Content: Thought content normal.     Assessment and Plan :  PDMP not reviewed this encounter.  1. Viral respiratory infection    Deferred imaging given clear cardiopulmonary exam, hemodynamically stable vital signs.  Suspect viral URI, viral syndrome. Physical exam findings reassuring and vital signs stable for discharge. Advised supportive care, offered symptomatic relief. Counseled patient on potential for adverse effects with medications prescribed/recommended today, ER and return-to-clinic precautions discussed, patient verbalized understanding.     Christopher Savannah, [place] 08/01/24 1327

## 2024-08-01 NOTE — ED Triage Notes (Signed)
 Pt reports he felt he had fever lats night; nasal congestion started today. Ibuprofen  gives relief.   Pt had a negative COVID test today.

## 2024-08-19 ENCOUNTER — Ambulatory Visit
Admission: EM | Admit: 2024-08-19 | Discharge: 2024-08-19 | Disposition: A | Discharge status: Home / Self Care | Payer: Self-pay | Attending: Family Medicine | Admitting: Family Medicine

## 2024-08-19 DIAGNOSIS — K047 Periapical abscess without sinus: Secondary | ICD-10-CM

## 2024-08-19 MED ORDER — AMOXICILLIN-POT CLAVULANATE 875-125 MG PO TABS: 1.0000 | ORAL_TABLET | Freq: Two times a day (BID) | ORAL | 0 refills | Status: AC

## 2024-08-19 MED ORDER — NAPROXEN 500 MG PO TABS: 500.0000 mg | ORAL_TABLET | Freq: Two times a day (BID) | ORAL | 0 refills | Status: AC

## 2024-08-19 NOTE — ED Provider Notes (Signed)
 [facility]  Note:  This document was prepared using Conservation officer, historic buildings and may include unintentional dictation errors.  MRN: [id number] DOB: 21-Apr-1996  Subjective:   David Patterson is a 28 y.o. male presenting for 1 week history of recurrent left lower dental pain.  Patient has known history of difficulty with a tooth in that area.  He does have a dentist that he plans on following up with soon.  No fever, drainage of pus or bleeding.  No current facility-administered medications for this encounter.  Current Outpatient Medications:  .  amoxicillin  (AMOXIL ) 500 MG tablet, Take 500 mg by mouth 3 (three) times daily., Disp: , Rfl:  .  cetirizine (ZYRTEC ALLERGY) 10 MG tablet, Take 1 tablet (10 mg total) by mouth daily., Disp: 30 tablet, Rfl: 0 .  ibuprofen  (ADVIL ) 200 MG tablet, Take 200 mg by mouth every 6 (six) hours as needed. Last taken: 400mg  around 1145am., Disp: , Rfl:  .  naproxen  (NAPROSYN ) 500 MG tablet, Take 1 tablet (500 mg total) by mouth 2 (two) times daily., Disp: 30 tablet, Rfl: 0 .  ondansetron  (ZOFRAN ) 4 MG tablet, Take 1 tablet (4 mg total) by mouth every 8 (eight) hours as needed for nausea or vomiting., Disp: 12 tablet, Rfl: 0 .  pseudoephedrine (SUDAFED) 60 MG tablet, Take 1 tablet (60 mg total) by mouth every 8 (eight) hours as needed for congestion., Disp: 30 tablet, Rfl: 0   Allergies  Allergen Reactions  . Mango Flavoring Agent (Non-Screening) Swelling    Mango and Mango Flavoring. Breakouts on face and facial swelling.     No past medical history on file.   No past surgical history on file.  No family history on file.  Social History   Tobacco Use  . Smoking status: Never    Passive exposure: Never  . Smokeless tobacco: Never  Vaping Use  . Vaping status: Never Used  Substance Use Topics  . Alcohol use: Not Currently  . Drug use: Never    ROS   Objective:   Vitals: BP 126/80   Pulse 72   Resp 16    SpO2 95%   Physical Exam Constitutional:      General: He is not in acute distress.    Appearance: Normal appearance. He is well-developed and normal weight. He is not ill-appearing, toxic-appearing or diaphoretic.  HENT:     Head: Normocephalic and atraumatic.     Right Ear: External ear normal.     Left Ear: External ear normal.     Nose: Nose normal.     Mouth/Throat:     Dentition: Normal dentition. Does not have dentures. No dental tenderness, gingival swelling, dental caries, dental abscesses or gum lesions.     Pharynx: Oropharynx is clear. No pharyngeal swelling, oropharyngeal exudate, posterior oropharyngeal erythema or uvula swelling.     Tonsils: No tonsillar exudate or tonsillar abscesses. 0 on the right. 0 on the left.   Eyes:     General: No scleral icterus.       Right eye: No discharge.        Left eye: No discharge.     Extraocular Movements: Extraocular movements intact.  Cardiovascular:     Rate and Rhythm: Normal rate.  Pulmonary:     Effort: Pulmonary effort is normal.  Musculoskeletal:     Cervical back: Normal range of motion.  Neurological:     Mental Status: He is alert and oriented to person, place,  and time.  Psychiatric:        Mood and Affect: Mood normal.        Behavior: Behavior normal.        Thought Content: Thought content normal.        Judgment: Judgment normal.     Assessment and Plan :   PDMP not reviewed this encounter.  1. Dental infection    Start Augmentin  for dental infection/abscess, use naproxen  for pain and inflammation. Emphasized need to keep dental specialist consult. Counseled patient on potential for adverse effects with medications prescribed/recommended today, strict ER and return-to-clinic precautions discussed, patient verbalized understanding.    Christopher Savannah, PA-C 08/19/24 8558

## 2024-08-19 NOTE — ED Triage Notes (Signed)
 Pt present with c/o dental pain x one week. Pt states he has a cavity. States he will be seeing a dentist soon.
